# Patient Record
Sex: Female | Born: 1975 | Race: White | Hispanic: Yes | Smoking: Never smoker
Health system: Southern US, Community
[De-identification: ages and names within clinical notes are randomized; demographics above are authoritative.]

## PROBLEM LIST (undated history)

## (undated) DIAGNOSIS — E669 Obesity, unspecified: Secondary | ICD-10-CM

## (undated) DIAGNOSIS — L719 Rosacea, unspecified: Secondary | ICD-10-CM

## (undated) DIAGNOSIS — L309 Dermatitis, unspecified: Secondary | ICD-10-CM

## (undated) DIAGNOSIS — M79671 Pain in right foot: Secondary | ICD-10-CM

## (undated) DIAGNOSIS — N390 Urinary tract infection, site not specified: Secondary | ICD-10-CM

## (undated) DIAGNOSIS — E781 Pure hyperglyceridemia: Secondary | ICD-10-CM

## (undated) DIAGNOSIS — T7840XA Allergy, unspecified, initial encounter: Secondary | ICD-10-CM

## (undated) DIAGNOSIS — G43909 Migraine, unspecified, not intractable, without status migrainosus: Secondary | ICD-10-CM

## (undated) DIAGNOSIS — K429 Umbilical hernia without obstruction or gangrene: Secondary | ICD-10-CM

## (undated) HISTORY — DX: Allergy, unspecified, initial encounter: T78.40XA

## (undated) HISTORY — PX: HYSTEROSCOPY WITH NOVASURE: SHX5574

## (undated) HISTORY — DX: Dermatitis, unspecified: L30.9

## (undated) HISTORY — DX: Urinary tract infection, site not specified: N39.0

## (undated) HISTORY — DX: Obesity, unspecified: E66.9

## (undated) HISTORY — DX: Rosacea, unspecified: L71.9

## (undated) HISTORY — DX: Pain in right foot: M79.671

## (undated) HISTORY — PX: LASIK: SHX215

---

## 1998-11-01 ENCOUNTER — Other Ambulatory Visit: Admission: RE | Admit: 1998-11-01 | Discharge: 1998-11-01 | Payer: Self-pay | Admitting: Obstetrics and Gynecology

## 1998-12-27 ENCOUNTER — Encounter: Payer: Self-pay | Admitting: Obstetrics and Gynecology

## 1998-12-27 ENCOUNTER — Ambulatory Visit (HOSPITAL_COMMUNITY): Admission: RE | Admit: 1998-12-27 | Discharge: 1998-12-27 | Payer: Self-pay | Admitting: Obstetrics and Gynecology

## 1999-02-21 ENCOUNTER — Encounter: Payer: Self-pay | Admitting: *Deleted

## 1999-02-21 ENCOUNTER — Ambulatory Visit (HOSPITAL_COMMUNITY): Admission: RE | Admit: 1999-02-21 | Discharge: 1999-02-21 | Payer: Self-pay | Admitting: *Deleted

## 1999-05-22 ENCOUNTER — Inpatient Hospital Stay (HOSPITAL_COMMUNITY): Admission: AD | Admit: 1999-05-22 | Discharge: 1999-05-22 | Payer: Self-pay | Admitting: Obstetrics and Gynecology

## 1999-05-26 ENCOUNTER — Encounter (HOSPITAL_COMMUNITY): Admission: AD | Admit: 1999-05-26 | Discharge: 1999-05-27 | Payer: Self-pay | Admitting: Obstetrics and Gynecology

## 1999-05-26 ENCOUNTER — Inpatient Hospital Stay (HOSPITAL_COMMUNITY): Admission: AD | Admit: 1999-05-26 | Discharge: 1999-05-28 | Payer: Self-pay | Admitting: Obstetrics and Gynecology

## 1999-11-06 ENCOUNTER — Other Ambulatory Visit: Admission: RE | Admit: 1999-11-06 | Discharge: 1999-11-06 | Payer: Self-pay | Admitting: Obstetrics and Gynecology

## 2000-11-03 ENCOUNTER — Other Ambulatory Visit: Admission: RE | Admit: 2000-11-03 | Discharge: 2000-11-03 | Payer: Self-pay | Admitting: Obstetrics and Gynecology

## 2001-02-14 ENCOUNTER — Ambulatory Visit (HOSPITAL_COMMUNITY): Admission: RE | Admit: 2001-02-14 | Discharge: 2001-02-14 | Payer: Self-pay | Admitting: Obstetrics and Gynecology

## 2001-02-14 ENCOUNTER — Encounter: Payer: Self-pay | Admitting: Obstetrics and Gynecology

## 2001-07-09 ENCOUNTER — Inpatient Hospital Stay (HOSPITAL_COMMUNITY): Admission: AD | Admit: 2001-07-09 | Discharge: 2001-07-11 | Payer: Self-pay | Admitting: Obstetrics and Gynecology

## 2001-11-29 ENCOUNTER — Ambulatory Visit: Admit: 2001-11-29 | Disposition: A | Discharge status: Home / Self Care | Payer: Self-pay

## 2002-12-07 ENCOUNTER — Ambulatory Visit: Admit: 2002-12-07 | Disposition: A | Discharge status: Home / Self Care | Payer: Self-pay

## 2003-01-22 ENCOUNTER — Ambulatory Visit (HOSPITAL_COMMUNITY): Admission: RE | Admit: 2003-01-22 | Discharge: 2003-01-22 | Payer: Self-pay | Admitting: Family Medicine

## 2003-04-23 ENCOUNTER — Encounter: Admission: RE | Admit: 2003-04-23 | Discharge: 2003-04-23 | Payer: Self-pay | Admitting: Family Medicine

## 2003-11-30 ENCOUNTER — Ambulatory Visit: Admit: 2003-11-30 | Disposition: A | Discharge status: Home / Self Care | Payer: Self-pay

## 2004-01-09 ENCOUNTER — Encounter (INDEPENDENT_AMBULATORY_CARE_PROVIDER_SITE_OTHER): Payer: Self-pay | Admitting: *Deleted

## 2004-01-09 ENCOUNTER — Ambulatory Visit (HOSPITAL_COMMUNITY): Admission: RE | Admit: 2004-01-09 | Discharge: 2004-01-09 | Payer: Self-pay | Admitting: Obstetrics and Gynecology

## 2004-01-09 HISTORY — PX: DILATION AND CURETTAGE OF UTERUS: SHX78

## 2005-04-09 ENCOUNTER — Other Ambulatory Visit: Admission: RE | Admit: 2005-04-09 | Discharge: 2005-04-09 | Payer: Self-pay | Admitting: Obstetrics and Gynecology

## 2005-09-19 ENCOUNTER — Inpatient Hospital Stay (HOSPITAL_COMMUNITY): Admission: AD | Admit: 2005-09-19 | Discharge: 2005-09-19 | Payer: Self-pay | Admitting: Obstetrics and Gynecology

## 2005-09-20 ENCOUNTER — Inpatient Hospital Stay (HOSPITAL_COMMUNITY): Admission: AD | Admit: 2005-09-20 | Discharge: 2005-09-20 | Payer: Self-pay | Admitting: Internal Medicine

## 2005-09-26 ENCOUNTER — Inpatient Hospital Stay (HOSPITAL_COMMUNITY): Admission: AD | Admit: 2005-09-26 | Discharge: 2005-09-28 | Payer: Self-pay | Admitting: Obstetrics and Gynecology

## 2007-10-21 ENCOUNTER — Other Ambulatory Visit: Admission: RE | Admit: 2007-10-21 | Discharge: 2007-10-21 | Payer: Self-pay | Admitting: Obstetrics and Gynecology

## 2007-12-12 ENCOUNTER — Ambulatory Visit (HOSPITAL_COMMUNITY): Admission: RE | Admit: 2007-12-12 | Discharge: 2007-12-12 | Payer: Self-pay | Admitting: Obstetrics & Gynecology

## 2010-04-05 ENCOUNTER — Encounter: Payer: Self-pay | Admitting: Family Medicine

## 2010-07-29 NOTE — Op Note (Signed)
NAME:  Sara Sellers, Sara Sellers                  ACCOUNT NO.:  [id number]   MEDICAL RECORD NO.:  [id number]          PATIENT TYPE:  AMB   LOCATION:  SDC                           FACILITY:  [facility]   PHYSICIAN:  M. Leda Quail, MD  DATE OF BIRTH:  [birth year]   DATE OF PROCEDURE:  12/12/2007  DATE OF DISCHARGE:                               OPERATIVE REPORT   PREOPERATIVE DIAGNOSES:  51. A 35 year old, gravida 4, para 3, married white female with      menorrhagia.  2. History of elevated small-particle low-density lipoproteins.  3. Husband with vasectomy.   POSTOPERATIVE DIAGNOSES:  25. A 36 year old, gravida 4, para 3, married white female with      menorrhagia.  2. History of elevated small-particle low-density lipoproteins.  3. Husband with vasectomy.   PROCEDURE:  Hysteroscopy with NovaSure ablation.   SURGEON:  M. Leda Quail, MD   ASSISTANT:  OR staff.   ANESTHESIA:  LMA, Quillian Quince, MD saw the case.   FINDINGS:  On hysteroscopy, normal-appearing endometrial cavity with  normal-appearing fallopian tubes or tubal ostia.   SPECIMEN:  No specimens were obtained.  However, the patient has had a  preoperative endometrial biopsy, which was benign for any abnormal  cells.   ESTIMATED BLOOD LOSS:  Minimal.   FLUIDS:  1000 mL of LR.   FLUID DEFICIT:  From the hysteroscopy, 60 mL of 3% sorbitol with fair  amount of this on the floor.   URINE OUTPUT:  30 mL drained with an I and O catheterization at the  beginning of the procedure.   INDICATIONS:  [name] is a 35 year old, G4, P3, married white female  who has a history of significant menorrhagia.  She has regular cycles,  but her flow lasts 5-7 days with very heavy flow x2 days using super  pads, often changing every hour or so, with clots and back pain.  She  does have a history of headache and some migraines, which are made worse  with estrogen-containing birth control pills.  She is not a candidate  for this, so we  discussed other options including Mirena IUD versus  ablation.  She is very concerned about receiving any hormones, which she  does not have to, and so we decided to proceed with an ablation.  Risks  and benefits have been explained to her and are documented in our office  chart.  She did have a preoperative biopsy, which showed normal  endometrial cells.  The patient presents for procedure today.   PROCEDURE IN DETAIL:  The patient was taken to the operating room.  She  was placed in a supine position.  Anesthesia was administered by the  anesthesia staff without difficulty.  Legs were positioned in the high  lithotomy position in the Gross stirrups.  SCDs were on the lower  extremities bilaterally.  Perineum, inner thighs, and vagina were  prepped and draped in normal sterile fashion.  A red rubber Foley  catheter was used to drain the bladder of all urine.  A bivalve speculum  was placed in the  vagina.  The anterior lip of the cervix was grasped  with single-tooth tenaculum.  The uterus sounded to 9 cm.  This was in  agreement with what we measured on ultrasound.  The cervix was then  dilated with Shawnie Pons dilators to #15.  A 2.9-mm diagnostic hysteroscope  was then obtained.  This was passed into the endometrial canal.  A 3%  sorbitol was used as a hysteroscopic media.  The cavity was visualized.  No lesions were noted.  No abnormality of the cavity was noted.  Tubal  ostia noted bilaterally.  Photo documentation was made.  The  hysteroscope was removed.  Using [facility] dilators, cervix was then dilated  up to #8.  A paracervical block with 1% lidocaine mixed 1:1 with  epinephrine (1:100,000 units) was instilled to the cervix.  A 2.5 mL  were instilled at the 3, 6, 9, and 12 o'clock positions respectively.  A  10 mL total was used for the paracervical block.  The NovaSure apparatus  was obtained.  This was opened outside of the patient.  The array was  working properly.  The vacuum bowel  was tapped.  Then, the device was  placed inside the uterus.  Then, the array was opened inside the  endometrial cavity.  The device was seated by moving the device to the  right and left and up and down twisting to 9 degrees in each direction.  This was performed twice.  The cavity length had previously been  measured with a total cavity being 4.5 cm and the cervical length being  4.5 cm with a cavity length of 4.5 cm.  The cavity width on the NovaSure  device was 4.5 as well.  These were set at the NovaSure device.  The  cervical seal was applied across the tip of the cervix.  The cavity  assessment test was performed without difficulty.  This was passed  easily.  Then, the ablation cycle was performed.  This was performed for  120 seconds with a power of 111.  Once the ablation cycle was completed,  the array was closed and was withdrawn from the uterine cavity.  The  device was opened outside of the patient, and cauterized tissue was  noted.  Hysteroscopy was then performed to visualize the endometrial  cavity, and good ablation was noted.  There was no fluid deficit during  this portion of the procedure.  Photo documentation was made, and the  hysteroscope was removed.  The tenaculum was removed from the cervix.  Silver nitrate was used to make the tenaculum sites hemostatic.  All of  the instruments were removed from the vagina.  The perineum was cleansed  with a Betadine prep.  The legs were positioned back in a supine  position.  The patient was awakened from anesthesia and extubated  without difficulty.   Sponge, lap, needle, and instrument counts were correct x2.  The patient  tolerated the procedure well and was taken to the recovery room in  stable condition.      Lum Keas, MD  Electronically Signed     MSM/MEDQ  D:  12/12/2007  T:  12/12/2007  Job:  [id number]

## 2010-08-01 NOTE — Discharge Summary (Signed)
[facility]  Patient:    Sara Sellers, Sara Sellers                         MRN: [id number] Adm. Date:  [id number] Disc. Date: [id number] Attending:  Oliver Pila                           Discharge Summary  DISCHARGE DIAGNOSES:          1. Status post term pregnancy at 40+ weeks,                                  delivered.                               2. Status post normal spontaneous vaginal delivery.  DISCHARGE MEDICATIONS:        1. Motrin 600 mg p.o. every six hours.                               2. Prenatal vitamins one p.o. daily.  FOLLOW-UP:                    The patient is to follow up in our office in approximately six weeks for her routine visit.  HOSPITAL COURSE:              The patient is a 35 year old, gravida 1, para 0, t 40+ weeks who is admitted with contractions every four minutes for several hours. She had had some bloody show and rupture of membranes on admission in triage.  PRENATAL LABORATORY DATA:     O positive, antibody negative, RPR nonreactive, rubella immune, hepatitis B surface antigen negative, HIV declined, GC negative, Chlamydia negative, and GBS negative.  PAST OBSTETRIC HISTORY:       None.  PAST GYN HISTORY:             None.  PAST SURGICAL HISTORY:        None.  PAST MEDICAL HISTORY:         None.  ALLERGIES:                    SEPTRA and AMOXICILLIN.  MEDICATIONS:                  1. Prenatal vitamins.                               2. Iron.  PHYSICAL EXAMINATION:         VITAL SIGNS: On admission she was afebrile with stable vital signs.  PELVIC: Cervix was completely effaced, 6 cm, and 0 station. She was grossly ruptured with clear fluid.  Fetal heart rate was reassuring.  HOSPITAL COURSE:              The patient progressed very quickly to complete and pushed well with no anesthesia with a vigorous female infant delivered.  Apgars  were 9 and 9.  Weight was 6 pounds 6 ounces.  The placenta  delivered spontaneously. She had a right labial laceration which was repaired with interrupted figure-of-eight sutures of 3-0 Vicryl.  The cervix, rectum, and perineum were intact.  Estimated blood loss was approximately 350 cc.  On postpartum day #2, he patient was doing well.  She was afebrile with stable vital signs.  Her pain was well controlled.  Therefore, she was discharged to home with follow-up and medications as previously stated. DD:  05/28/99 TD:  05/28/99 Job: [id number]

## 2010-08-01 NOTE — Discharge Summary (Signed)
[facility]  Patient:    Sara Sellers, Sara Sellers Visit Number: [id number] MRN: [id number]          Service Type: OBS Location: 910A 9135 01 Attending Physician:  Malon Kindle Dictated by:   Malachi Pro. Ambrose Mantle, M.D. Admit Date:  07/09/2001 Discharge Date: 07/11/2001                             Discharge Summary  HISTORY OF PRESENT ILLNESS:   The patient is a 35 year old white married female, para 1-0-0-1, gravida 2, last period October 03, 2000, [facility] July 11, 2001 by dates and Jul 17, 2001 by ultrasound admitted in active labor.  Blood group and type O positive with a negative antibody, nonreactive serology, rubella immune, hepatitis B surface antigen negative, HIV declined, GC and Chlamydia negative, declined triple screen, 1-hour glucola 97, group B strep negative. Vaginal ultrasound on December 08, 2000 crown-rump length 1.85 cm, 8 weeks 3 days, [facility] Jul 17, 2001.  Repeat ultrasound on February 14, 2001 average gestational age [redacted] weeks 1 day, [facility] Jul 17, 2001.  Prenatal care was uncomplicated.  She began contracting on July 08, 2001 and came here at approximately 1:30 a.m. on the day of admission.  The cervix was 4 cm in MAU and after arriving on labor and delivery was 7 cm.  PAST MEDICAL HISTORY:  ALLERGIES:                    SEPTRA and AMOXICILLIN.  ILLNESSES:                    Migraines.  OPERATIONS:                   None.  FAMILY HISTORY:               Maternal grandfather with coronary artery disease, high blood pressure, bone and lung cancer and diabetes.  Maternal grandmother congestive heart failure, high blood pressure, diabetes, thyroid trouble.  Mother with high blood pressure.  ALCOHOL/TOBACCO/DRUGS:        None.  OBSTETRIC HISTORY:            In March 2001, the patient had a 6-pound 6-ounce female delivered vaginally.  HOSPITAL COURSE:              On admission her vital signs were normal, the abdomen was soft, fundal height  had been 37 cm on June 27, 2001, fetal heart tones were normal, contractions every 2 minutes, cervix 9 cm, 100%, vertex at -1.  The patient rapidly progressed to full dilatation and brought the vertex to the perineum.  She delivered spontaneously ROA over a second-degree midline laceration by Dr. Ambrose Mantle a living female infant 7 pounds 14 ounces, Apgars of 9 at one and 9 at five minutes.  Placenta was intact.  Uterus normal. Second-degree midline laceration repaired with 3-0 Dexon.  Uterus somewhat boggy and clots removed.                                Postpartum the patient did well and was discharged on the second postpartum day.  LABORATORY DATA:              Initial hemoglobin 13.6, hematocrit 38.6, white count 9300, platelet count 157,000.  Followup hemoglobin 11.9, hematocrit 34.2, white count 9100, platelet count 139,000.  RPR was nonreactive.  FINAL DIAGNOSIS:              Intrauterine pregnancy 39 weeks delivered right                               occiput anterior.  OPERATIONS:                   1. Spontaneous delivery ROA.                               2. Repair of second-degree midline laceration.  FINAL CONDITION:              Improved.  DISCHARGE INSTRUCTIONS:       Instructions include our regular discharge instruction booklet.  She is given a prescription for Motrin 600 mg 20 tablets one every 6 hours as needed for pain.  She is to return in 6 weeks for followup examination.Dictated by:   Malachi Pro. Ambrose Mantle, M.D. Attending Physician:  Malon Kindle DD:  07/11/01 TD:  07/11/01 Job: [phone] [id number]

## 2010-08-01 NOTE — Discharge Summary (Signed)
Sara Sellers, Sara Sellers                  ACCOUNT NO.:  [id number]   MEDICAL RECORD NO.:  [id number]          PATIENT TYPE:  INP   LOCATION:  9120                          FACILITY:  [facility]   PHYSICIAN:  Malachi Pro. Ambrose Mantle, M.D. DATE OF BIRTH:  1975-09-01   DATE OF ADMISSION:  09/26/2005  DATE OF DISCHARGE:  09/28/2005                                 DISCHARGE SUMMARY   A 35 year old white female para 2-0-1-2, gravida 4, EDC October 08, 2005 by 11-  week ultrasound presented complaining of contractions and bloody show.  On  evaluation in the maternity admission unit the cervix changed from 3 to 4 cm  and the patient was admitted.  Blood group and type O+ with a negative  antibody, RPR nonreactive, rubella equivocal, hepatitis B surface antigen  negative, GC and chlamydia negative.  One-hour Glucola 108.  Group B Strep  negative.   PRENATAL CARE:  The patient had migraines, otherwise uncomplicated.   OBSTETRIC HISTORY:  Spontaneous vaginal delivery at term x2 6 pounds 6  ounces and 7 pounds 14 ounces, spontaneous abortion x1.   PAST MEDICAL HISTORY:  Migraines.   ALLERGIES:  SEPTRA and AMOXICILLIN.   SURGICAL HISTORY:  Negative.   MEDICATIONS:  None.   On admission the patient was afebrile with normal vital signs.  Heart and  lungs were normal.  Abdomen: Soft, gravid.  Estimated fetal weight about 8  pounds.  Cervix was 9 cm, 90%, vertex at a zero station.  Artificial rupture  of the membranes produced clear fluid.  Dr. Jackelyn Knife was in the room next  door and was called to say the baby was out.  He walked in the room and the  baby was on the mother's abdomen.  There had been an apparent spontaneous  vaginal delivery of a living female infant 8 pounds 8 ounces, Apgars of 8 at  one and 9 at five minutes.  Dr. Jackelyn Knife clamped the cord and delivered the  placenta spontaneously and intact.  First degree laceration repaired with 3-  0 Vicryl with local block.  Blood loss about 500 mL.  Postpartum  the patient  did well and was discharged on the second postpartum day.  Laboratory data  showed an initial hemoglobin of 13.8, hematocrit 39.3, white count 11,800,  platelet count 169,000.  Follow-up hemoglobin 11.7, hematocrit 33.4.  RPR  was nonreactive.   FINAL DIAGNOSIS:  Intrauterine pregnancy at 38-1/2 weeks delivered vertex.   OPERATION:  1.  Spontaneous delivery vertex.  2.  Repair of small perineal laceration.   FINAL CONDITION:  Improved.   INSTRUCTIONS:  Include our regular discharge instruction booklet.  She is  counseled about the need for rubella vaccine.  Patient is advised to return  in six weeks for follow-up examination.  She declines analgesics at  discharge.      Malachi Pro. Ambrose Mantle, M.D.  Electronically Signed     TFH/MEDQ  D:  09/28/2005  T:  09/28/2005  Job:  [id number]

## 2010-08-01 NOTE — Op Note (Signed)
NAME:  Sara Sellers, Sara Sellers                  ACCOUNT NO.:  [id number]   MEDICAL RECORD NO.:  [id number]          PATIENT TYPE:  AMB   LOCATION:  DAY                          FACILITY:  [facility]   PHYSICIAN:  Malachi Pro. Ambrose Mantle, M.D. DATE OF BIRTH:  1975-08-18   DATE OF PROCEDURE:  01/09/2004  DATE OF DISCHARGE:                                 OPERATIVE REPORT   PREOPERATIVE DIAGNOSIS:  Nonviable intrauterine pregnancy at approximately  10 weeks.  Embryo never grew, never was more than 0.46 cm long.  Never had a  heartbeat.   POSTOPERATIVE DIAGNOSIS:  Nonviable intrauterine pregnancy at approximately  10 weeks.  Embryo never grew, never was more than 0.46 cm long.  Never had a  heartbeat.   OPERATION:  Suction dilation and curettage.   OPERATION:  Malachi Pro. Ambrose Mantle, M.D.   ANESTHESIA:  General anesthesia.   The patient was brought to the operating room and placed under satisfactory  general anesthesia and then placed in lithotomy position.  The vulva,  vagina, and perineum were prepped with Betadine solution.  On examination,  the bladder was full, so I emptied the bladder with a [place] catheter.  Then  the uterus could be felt anterior, upper limit of normal size.  The adnexa  were free of masses.  Cervix was drawn into the operative field after the  area was prepped and draped sterilely.  The uterus sounded to about 9 cm  anteriorly.  The cervix was dilated up with Hank dilators until a #8 curved  suction curet would pass freely into the endometrial cavity.  I then did a  suction D&C, followed that with a sharp D&C to ensure that the walls felt  smooth, and did a final circuit with the suction curet.  Where the tenaculum  site had been, both areas bled and did not respond to pressure, so a 2-0  chromic suture was used to control the bleeding.  At the termination of the  procedure, there was no bleeding.  The patient seemed to be doing well.  Estimation of blood loss was difficult because the  surgical assistant had  suctioned the tubing clear with saline, and the amount of blood in the  suction before this had not been noticed.  It was estimated that the blood  was about 100 cc.  Sponge and needle counts were correct, and the patient  was returned to recovery in satisfactory condition.      TFH/MEDQ  D:  01/09/2004  T:  01/09/2004  Job:  [id number]

## 2010-12-15 LAB — URINALYSIS, ROUTINE W REFLEX MICROSCOPIC
Nitrite: NEGATIVE
Protein, ur: NEGATIVE
Specific Gravity, Urine: >1.03 — ABNORMAL HIGH
Urobilinogen, UA: 0.2

## 2010-12-15 LAB — CBC
Platelets: 244
RDW: 11.4 — ABNORMAL LOW
WBC: 4.6

## 2010-12-15 LAB — PREGNANCY, URINE: Preg Test, Ur: NEGATIVE

## 2011-05-10 ENCOUNTER — Emergency Department
Admission: EM | Admit: 2011-05-10 | Discharge: 2011-05-10 | Disposition: A | Discharge status: Home / Self Care | Payer: 59 | Source: Home / Self Care

## 2011-05-10 DIAGNOSIS — M25512 Pain in left shoulder: Secondary | ICD-10-CM

## 2011-05-10 DIAGNOSIS — M25519 Pain in unspecified shoulder: Secondary | ICD-10-CM

## 2011-05-10 NOTE — ED Notes (Signed)
States on Friday she was pushing herself up in bed with her elbow and felt something slip in left shoulder. States pain radiates down her hand.

## 2011-05-10 NOTE — ED Provider Notes (Signed)
History     CSN: [id number]  Arrival date & time 05/10/11  1114   None     Chief Complaint  Patient presents with  . Shoulder Pain    (Consider location/radiation/quality/duration/timing/severity/associated sxs/prior treatment) HPI This is a right-handed female who states that 2 days ago she was pushing herself up on her elbow and felt something slip pop around her left shoulder. She states that the pain radiated down into her elbow and her arm. Since then she has had some uncomfortable feelings distally in her left arm and feels tenderness in her axilla. She does not notice any weakness. She is able to move her arm around normally. 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 She has not been using any medications or modalities. She states the pain is sharp.  No past medical history on file.  No past surgical history on file.  No family history on file.  History  Substance Use Topics  . Smoking status: Not on file  . Smokeless tobacco: Not on file  . Alcohol Use: Not on file    OB History    No data available      Review of Systems  Allergies  Amoxicillin and Septra  Home Medications  No current outpatient prescriptions on file.  BP 131/87  Pulse 71  Temp(Src) 98.3 F (36.8 C) (Oral)  Resp 18  Ht 5\' 1"  (1.549 m)  Wt 169 lb 9 oz (76.913 kg)  BMI 32.04 kg/m2  SpO2 99%  Physical Exam  Nursing note and vitals reviewed. Constitutional: She is oriented to person, place, and time. She appears well-developed and well-nourished.  HENT:  Head: Normocephalic and atraumatic.  Eyes: No scleral icterus.  Neck: Neck supple.  Cardiovascular: Regular rhythm and normal heart sounds.   Pulmonary/Chest: Effort normal and breath sounds normal. No respiratory distress.  Musculoskeletal:       Left arm examination demonstrates a normal distal neurovascular status.. And sensation of the median, ulnar,  and radial nerves. Spurling test and the neck is negative. There is no neck or spinous process tenderness. Full range of motion of the neck. Shoulder examination demonstrates full range of motion and provocative tests to cause some soreness in the axilla. She is mostly tender to palpation in the axillary  muscles such as the pectoralis and serratus.  Neurological: She is alert and oriented to person, place, and time.  Skin: Skin is warm and dry.  Psychiatric: She has a normal mood and affect. Her speech is normal.    ED Course  Procedures (including critical care time)  Labs Reviewed - No data to display No results found.   1. Left shoulder pain       MDM   I feel that her pain is most likely due to muscle strain in water axillary muscles. I believe that these temporarily pushed on part or all of her brachial plexus and caused some of his sensation that she is feeling. However I believe that this is most likely a temporary thing. I advised her to take naproxen twice a day as well as ice the area. If her pain is not resolving in the next week, then the next step should be taken about getting her to see a sports medicine provider and have x-rays taken. The patient understands and agrees with this course of action.      Lily Kocher, MD 05/10/11 [phone]

## 2012-11-10 ENCOUNTER — Encounter: Payer: Self-pay | Admitting: Certified Nurse Midwife

## 2012-11-10 ENCOUNTER — Ambulatory Visit (INDEPENDENT_AMBULATORY_CARE_PROVIDER_SITE_OTHER): Payer: 59 | Admitting: Certified Nurse Midwife

## 2012-11-10 DIAGNOSIS — Z0000 Encounter for general adult medical examination without abnormal findings: Secondary | ICD-10-CM

## 2012-11-10 DIAGNOSIS — Z01419 Encounter for gynecological examination (general) (routine) without abnormal findings: Secondary | ICD-10-CM

## 2012-11-10 LAB — POCT URINALYSIS DIPSTICK
Urobilinogen, UA: NEGATIVE
pH, UA: 5

## 2012-11-10 NOTE — Patient Instructions (Signed)

## 2012-11-10 NOTE — Progress Notes (Signed)
37 y.o. G4P3  Married Caucasian F here for annual exam.  Periods none since ablation. Occasional menstrual like symptoms monthly. Did not follow up with elevated triglyceride level with pcp, but has appointment scheduled now.  Also has appointment scheduled for consult on umbilical hernia repair. No health issues today.. Denies any urinary symptoms or vaginal symptoms, but experiencing menstrual like symptoms today.  Patient's last menstrual period was 11/15/2006.          Sexually active: yes  The current method of family planning is spouse vasectomy.    Exercising: yes  walk at work, walking once/wk outside of work Smoker:  no  Health Maintenance: Pap:  11/09/11 NEG HR HPV History of abnormal Pap:  no MMG:  none Colonoscopy:  none BMD:   none TDaP:  2010 Screening Labs: Hb today: PCP, Urine today: Leuks 2     No past medical history on file.  Past Surgical History  Procedure Laterality Date  . Novasure ablation  9/09  . Dilation and curettage of uterus  2005  . Hysteroscopy  9/09    & novasure ablation    No current outpatient prescriptions on file.   No current facility-administered medications for this visit.    No family history on file.  ROS:  Pertinent items are noted in HPI.  Otherwise, a comprehensive ROS was negative.  Exam:   LMP 11/15/2006  Weight change: @WEIGHTCHANGE @ Height:      Ht Readings from Last 3 Encounters:  05/10/11 5\' 1"  (1.549 m)    General appearance: alert, cooperative and appears stated age Head: Normocephalic, without obvious abnormality, atraumatic Neck: no adenopathy, supple, symmetrical, trachea midline and thyroid normal to inspection and palpation Lungs: clear to auscultation bilaterally Breasts: normal appearance, no masses or tenderness, No nipple retraction or dimpling, No nipple discharge or bleeding, No axillary or supraclavicular adenopathy Heart: regular rate and rhythm Abdomen: soft, non-tender; bowel sounds normal; no  masses,  no organomegaly, umbilical hernia noted Extremities: extremities normal, atraumatic, no cyanosis or edema Skin: Skin color, texture, turgor normal. No rashes or lesions Lymph nodes: Cervical, supraclavicular, and axillary nodes normal. No abnormal inguinal nodes palpated Neurologic: Grossly normal   Pelvic: External genitalia:  no lesions              Urethra:  normal appearing urethra with no masses, tenderness or lesions, bladder non tender              Bartholins and Skenes: normal                 Vagina: normal appearing vagina with normal color and normal discharge, no lesions              Cervix: normal, non tender              Pap taken: no Bimanual Exam:  Uterus:  normal size, contour, position, consistency, mobility, non-tender and anteflexed              Adnexa: normal adnexa and no mass, fullness, tenderness               Rectovaginal: Confirms               Anus:  normal sphincter tone, no lesions  A:  Well Woman with normal exam  Ablation 2008 working well  History of elevated triglycerides with PCP appointment now  Umbilical hernia has appointment for repair option  P:   Reviewed health and wellness pertinent to exam  Stressed importance of follow up and life long health implications if untreated. Patient understands.  Keep appointment as planned  pap smear not taken today  counseled on breast self exam, adequate intake of calcium and vitamin D, diet and exercise, Kegel's exercises  return annually or prn  An After Visit Summary was printed and given to the patient.

## 2012-11-10 NOTE — Progress Notes (Signed)
Note reviewed, agree with plan.  Noemi Ishmael, MD  

## 2012-11-18 ENCOUNTER — Ambulatory Visit (INDEPENDENT_AMBULATORY_CARE_PROVIDER_SITE_OTHER): Payer: 59 | Admitting: General Surgery

## 2012-11-18 ENCOUNTER — Encounter (INDEPENDENT_AMBULATORY_CARE_PROVIDER_SITE_OTHER): Payer: Self-pay | Admitting: General Surgery

## 2012-11-18 VITALS — BP 118/72 | HR 70 | Temp 99.0°F | Resp 18 | Ht 61.0 in | Wt 170.0 lb

## 2012-11-18 DIAGNOSIS — K429 Umbilical hernia without obstruction or gangrene: Secondary | ICD-10-CM

## 2012-11-18 NOTE — Progress Notes (Signed)
Patient ID: Sara Sellers, female   DOB: 21-Jul-1975, 37 y.o.   MRN: [id number]  Chief Complaint  Patient presents with  . Establish Care    hernia    HPI Sara Sellers is a 37 y.o. female.  HPI This is a 37 year old female who presents with about a 13 year history of an umbilical hernia. This was noted with her first pregnancy. This is gotten bigger over that time period has now become much more symptomatic and is causing her pain and discomfort. She feels like that there are times where she cannot reduce this. She works as a Buyer, retail at [employer]. There's been no changes in either her bowel or bladder habits with this. She was actually scheduled for this before and decided that she did not want to do it at that time. She returns today and would like to discuss surgery. Past Medical History  Diagnosis Date  . Migraine with aura   . History of menorrhagia     Past Surgical History  Procedure Laterality Date  . Novasure ablation  9/09  . Dilation and curettage of uterus  2005  . Hysteroscopy  9/09    & novasure ablation    Family History  Problem Relation Age of Onset  . Diabetes Mother   . Uterine cancer Mother   . Hypertension Mother   . Hypertension Father   . Diabetes Maternal Grandmother   . Hypothyroidism Maternal Grandmother   . Diabetes Maternal Grandfather   . Hypertension Maternal Grandfather   . Heart disease Maternal Grandfather   . Cancer Maternal Grandfather     lung w/ mets to bone  . Hyperlipidemia Brother   . Stroke Paternal Grandmother   . Heart disease Paternal Grandfather   . Hyperlipidemia Brother     Social History History  Substance Use Topics  . Smoking status: Never Smoker   . Smokeless tobacco: Not on file  . Alcohol Use: No    Allergies  Allergen Reactions  . Amoxicillin Hives  . Septra [Bactrim] Hives    Current Outpatient Prescriptions  Medication Sig Dispense Refill  . fenofibrate 160 MG tablet        No current  facility-administered medications for this visit.    Review of Systems Review of Systems  Constitutional: Negative for fever, chills and unexpected weight change.  HENT: Negative for hearing loss, congestion, sore throat, trouble swallowing and voice change.   Eyes: Negative for visual disturbance.  Respiratory: Negative for cough and wheezing.   Cardiovascular: Negative for chest pain, palpitations and leg swelling.  Gastrointestinal: Negative for nausea, vomiting, abdominal pain, diarrhea, constipation, blood in stool, abdominal distention and anal bleeding.  Genitourinary: Negative for hematuria, vaginal bleeding and difficulty urinating.  Musculoskeletal: Negative for arthralgias.  Skin: Negative for rash and wound.  Neurological: Negative for seizures, syncope and headaches.  Hematological: Negative for adenopathy. Does not bruise/bleed easily.  Psychiatric/Behavioral: Negative for confusion.    Blood pressure 118/72, pulse 70, temperature 99 F (37.2 C), resp. rate 18, height 5\' 1"  (1.549 m), weight 170 lb (77.111 kg), last menstrual period 11/15/2006.  Physical Exam Physical Exam  Vitals reviewed. Constitutional: She appears well-developed and well-nourished.  Neck: Neck supple.  Cardiovascular: Normal rate, regular rhythm and normal heart sounds.   Pulmonary/Chest: Effort normal and breath sounds normal. She has no wheezes. She has no rales.  Abdominal: Soft. She exhibits no distension. There is no tenderness. A hernia (reducible umbilical hernia) is present.  Lymphadenopathy:  She has no cervical adenopathy.    Data Reviewed Prior notes  Assessment    Umbilical hernia     Plan    Umbilical hernia repair with mesh  The series done much more symptomatic. I discussed with her the pathophysiology of an umbilical hernia. We discussed both open and laparoscopic repair. I think that this would be most amenable to an open repair in this would be the simplest fix to  this. We discussed repairing this with mesh. We discussed the risks to include but not limited to bleeding, infection, and recurrence of this hernia. We discussed the postoperative course as well as 3-4 weeks out of work. We'll plan on doing this in November per her request.        Sara Sellers 11/18/2012, 10:21 AM

## 2013-01-14 DIAGNOSIS — K429 Umbilical hernia without obstruction or gangrene: Secondary | ICD-10-CM

## 2013-01-14 HISTORY — DX: Umbilical hernia without obstruction or gangrene: K42.9

## 2013-01-16 ENCOUNTER — Encounter (HOSPITAL_BASED_OUTPATIENT_CLINIC_OR_DEPARTMENT_OTHER): Payer: Self-pay | Admitting: *Deleted

## 2013-01-16 NOTE — Pre-Procedure Instructions (Signed)
To come for BMET and CBC, diff 

## 2013-01-19 ENCOUNTER — Other Ambulatory Visit: Payer: Self-pay

## 2013-01-20 ENCOUNTER — Encounter (HOSPITAL_BASED_OUTPATIENT_CLINIC_OR_DEPARTMENT_OTHER)
Admission: RE | Admit: 2013-01-20 | Discharge: 2013-01-20 | Disposition: A | Discharge status: Home / Self Care | Payer: 59 | Source: Ambulatory Visit | Attending: General Surgery | Admitting: General Surgery

## 2013-01-20 LAB — CBC WITH DIFFERENTIAL/PLATELET
Basophils Absolute: 0 10*3/uL (ref 0.0–0.1)
Basophils Relative: 0 % (ref 0–1)
Eosinophils Absolute: 0.1 10*3/uL (ref 0.0–0.7)
Eosinophils Relative: 1 % (ref 0–5)
Lymphs Abs: 1.3 10*3/uL (ref 0.7–4.0)
MCH: 30.9 pg (ref 26.0–34.0)
MCHC: 36.2 g/dL — ABNORMAL HIGH (ref 30.0–36.0)
MCV: 85.2 fL (ref 78.0–100.0)
Neutrophils Relative %: 58 % (ref 43–77)
Platelets: 286 10*3/uL (ref 150–400)
RBC: 4.47 MIL/uL (ref 3.87–5.11)
RDW: 11.3 % — ABNORMAL LOW (ref 11.5–15.5)

## 2013-01-20 LAB — BASIC METABOLIC PANEL
BUN: 16 mg/dL (ref 6–23)
CO2: 26 mEq/L (ref 19–32)
Calcium: 9.1 mg/dL (ref 8.4–10.5)
Creatinine, Ser: 1.13 mg/dL — ABNORMAL HIGH (ref 0.50–1.10)
GFR calc non Af Amer: 62 mL/min — ABNORMAL LOW (ref >90)
Glucose, Bld: 75 mg/dL (ref 70–99)
Sodium: 138 mEq/L (ref 135–145)

## 2013-01-23 ENCOUNTER — Ambulatory Visit (HOSPITAL_BASED_OUTPATIENT_CLINIC_OR_DEPARTMENT_OTHER): Payer: 59 | Admitting: Anesthesiology

## 2013-01-23 ENCOUNTER — Encounter (HOSPITAL_BASED_OUTPATIENT_CLINIC_OR_DEPARTMENT_OTHER)
Admission: RE | Disposition: A | Discharge status: Home / Self Care | Payer: Self-pay | Source: Ambulatory Visit | Attending: General Surgery

## 2013-01-23 ENCOUNTER — Ambulatory Visit (HOSPITAL_BASED_OUTPATIENT_CLINIC_OR_DEPARTMENT_OTHER)
Admission: RE | Admit: 2013-01-23 | Discharge: 2013-01-23 | Disposition: A | Discharge status: Home / Self Care | Payer: 59 | Source: Ambulatory Visit | Attending: General Surgery | Admitting: General Surgery

## 2013-01-23 ENCOUNTER — Encounter (HOSPITAL_BASED_OUTPATIENT_CLINIC_OR_DEPARTMENT_OTHER): Payer: 59 | Admitting: Anesthesiology

## 2013-01-23 ENCOUNTER — Encounter (HOSPITAL_BASED_OUTPATIENT_CLINIC_OR_DEPARTMENT_OTHER): Payer: Self-pay | Admitting: *Deleted

## 2013-01-23 DIAGNOSIS — K429 Umbilical hernia without obstruction or gangrene: Secondary | ICD-10-CM | POA: Insufficient documentation

## 2013-01-23 HISTORY — PX: HERNIA REPAIR: SHX51

## 2013-01-23 HISTORY — DX: Pure hyperglyceridemia: E78.1

## 2013-01-23 HISTORY — DX: Umbilical hernia without obstruction or gangrene: K42.9

## 2013-01-23 HISTORY — DX: Migraine, unspecified, not intractable, without status migrainosus: G43.909

## 2013-01-23 HISTORY — PX: UMBILICAL HERNIA REPAIR: SHX196

## 2013-01-23 SURGERY — REPAIR, HERNIA, UMBILICAL, ADULT
Anesthesia: General | Site: Abdomen | Wound class: Clean

## 2013-01-23 MED ORDER — CIPROFLOXACIN IN D5W 400 MG/200ML IV SOLN
INTRAVENOUS | Status: AC
  Filled 2013-01-23: qty 200

## 2013-01-23 MED ORDER — OXYCODONE-ACETAMINOPHEN 10-325 MG PO TABS: 1.0000 | ORAL_TABLET | Freq: Four times a day (QID) | ORAL | Status: DC | PRN

## 2013-01-23 MED ORDER — OXYCODONE HCL 5 MG PO TABS
ORAL_TABLET | ORAL | Status: AC
  Filled 2013-01-23: qty 1

## 2013-01-23 MED ORDER — MIDAZOLAM HCL 2 MG/2ML IJ SOLN
INTRAMUSCULAR | Status: AC
  Filled 2013-01-23: qty 2

## 2013-01-23 MED ORDER — BUPIVACAINE HCL (PF) 0.25 % IJ SOLN
INTRAMUSCULAR | Status: AC
  Filled 2013-01-23: qty 30

## 2013-01-23 MED ORDER — OXYCODONE HCL 5 MG/5ML PO SOLN: 5.0000 mg | Freq: Once | ORAL | Status: AC | PRN

## 2013-01-23 MED ORDER — FENTANYL CITRATE 0.05 MG/ML IJ SOLN: 50.0000 ug | INTRAMUSCULAR | Status: DC | PRN

## 2013-01-23 MED ORDER — LIDOCAINE HCL (CARDIAC) 20 MG/ML IV SOLN
INTRAVENOUS | Status: DC | PRN
  Administered 2013-01-23: 30 mg via INTRAVENOUS

## 2013-01-23 MED ORDER — ONDANSETRON HCL 4 MG/2ML IJ SOLN
INTRAMUSCULAR | Status: DC | PRN
  Administered 2013-01-23: 4 mg via INTRAVENOUS

## 2013-01-23 MED ORDER — MIDAZOLAM HCL 5 MG/5ML IJ SOLN
INTRAMUSCULAR | Status: DC | PRN
  Administered 2013-01-23: 2 mg via INTRAVENOUS

## 2013-01-23 MED ORDER — FENTANYL CITRATE 0.05 MG/ML IJ SOLN
25.0000 ug | INTRAMUSCULAR | Status: DC | PRN
  Administered 2013-01-23 (×2): 50 ug via INTRAVENOUS

## 2013-01-23 MED ORDER — BUPIVACAINE HCL (PF) 0.25 % IJ SOLN
INTRAMUSCULAR | Status: DC | PRN
  Administered 2013-01-23: 20 mL

## 2013-01-23 MED ORDER — MIDAZOLAM HCL 2 MG/ML PO SYRP: 12.0000 mg | ORAL_SOLUTION | Freq: Once | ORAL | Status: DC | PRN

## 2013-01-23 MED ORDER — DEXAMETHASONE SODIUM PHOSPHATE 4 MG/ML IJ SOLN
INTRAMUSCULAR | Status: DC | PRN
  Administered 2013-01-23: 10 mg via INTRAVENOUS

## 2013-01-23 MED ORDER — LACTATED RINGERS IV SOLN
INTRAVENOUS | Status: DC
  Administered 2013-01-23 (×2): via INTRAVENOUS

## 2013-01-23 MED ORDER — FENTANYL CITRATE 0.05 MG/ML IJ SOLN
INTRAMUSCULAR | Status: DC | PRN
  Administered 2013-01-23 (×2): 50 ug via INTRAVENOUS

## 2013-01-23 MED ORDER — ONDANSETRON HCL 4 MG/2ML IJ SOLN: 4.0000 mg | Freq: Once | INTRAMUSCULAR | Status: DC | PRN

## 2013-01-23 MED ORDER — CIPROFLOXACIN IN D5W 400 MG/200ML IV SOLN
400.0000 mg | INTRAVENOUS | Status: AC
  Administered 2013-01-23: 400 mg via INTRAVENOUS

## 2013-01-23 MED ORDER — PROPOFOL 10 MG/ML IV EMUL
INTRAVENOUS | Status: AC
  Filled 2013-01-23: qty 50

## 2013-01-23 MED ORDER — MIDAZOLAM HCL 2 MG/2ML IJ SOLN: 1.0000 mg | INTRAMUSCULAR | Status: DC | PRN

## 2013-01-23 MED ORDER — FENTANYL CITRATE 0.05 MG/ML IJ SOLN
INTRAMUSCULAR | Status: AC
  Filled 2013-01-23: qty 2

## 2013-01-23 MED ORDER — PROPOFOL 10 MG/ML IV BOLUS
INTRAVENOUS | Status: DC | PRN
  Administered 2013-01-23: 150 mg via INTRAVENOUS

## 2013-01-23 MED ORDER — FENTANYL CITRATE 0.05 MG/ML IJ SOLN
INTRAMUSCULAR | Status: AC
  Filled 2013-01-23: qty 6

## 2013-01-23 MED ORDER — OXYCODONE HCL 5 MG PO TABS
5.0000 mg | ORAL_TABLET | Freq: Once | ORAL | Status: AC | PRN
  Administered 2013-01-23: 5 mg via ORAL

## 2013-01-23 SURGICAL SUPPLY — 43 items
ADH SKN CLS APL DERMABOND .7 (GAUZE/BANDAGES/DRESSINGS)
APL SKNCLS STERI-STRIP NONHPOA (GAUZE/BANDAGES/DRESSINGS) ×2
BENZOIN TINCTURE PRP APPL 2/3 (GAUZE/BANDAGES/DRESSINGS) ×2 IMPLANT
BLADE SURG 15 STRL LF DISP TIS (BLADE) ×2 IMPLANT
BLADE SURG 15 STRL SS (BLADE) ×3
BLADE SURG ROTATE 9660 (MISCELLANEOUS) ×2 IMPLANT
CHLORAPREP W/TINT 26ML (MISCELLANEOUS) ×3 IMPLANT
COVER MAYO STAND STRL (DRAPES) ×3 IMPLANT
COVER TABLE BACK 60X90 (DRAPES) ×3 IMPLANT
DECANTER SPIKE VIAL GLASS SM (MISCELLANEOUS) ×2 IMPLANT
DERMABOND ADVANCED (GAUZE/BANDAGES/DRESSINGS)
DERMABOND ADVANCED .7 DNX12 (GAUZE/BANDAGES/DRESSINGS) IMPLANT
DRAPE PED LAPAROTOMY (DRAPES) ×3 IMPLANT
DRSG TEGADERM 4X4.75 (GAUZE/BANDAGES/DRESSINGS) ×2 IMPLANT
ELECT COATED BLADE 2.86 ST (ELECTRODE) ×3 IMPLANT
ELECT REM PT RETURN 9FT ADLT (ELECTROSURGICAL) ×3
ELECTRODE REM PT RTRN 9FT ADLT (ELECTROSURGICAL) ×2 IMPLANT
GLOVE BIO SURGEON STRL SZ7 (GLOVE) ×3 IMPLANT
GLOVE BIOGEL PI IND STRL 7.0 (GLOVE) ×2 IMPLANT
GLOVE BIOGEL PI IND STRL 7.5 (GLOVE) ×2 IMPLANT
GLOVE BIOGEL PI INDICATOR 7.0 (GLOVE) ×2
GLOVE BIOGEL PI INDICATOR 7.5 (GLOVE) ×1
GLOVE ECLIPSE 6.5 STRL STRAW (GLOVE) ×2 IMPLANT
GOWN PREVENTION PLUS XLARGE (GOWN DISPOSABLE) ×4 IMPLANT
NEEDLE HYPO 22GX1.5 SAFETY (NEEDLE) ×3 IMPLANT
NS IRRIG 1000ML POUR BTL (IV SOLUTION) IMPLANT
PACK BASIN DAY SURGERY FS (CUSTOM PROCEDURE TRAY) ×3 IMPLANT
PENCIL BUTTON HOLSTER BLD 10FT (ELECTRODE) ×3 IMPLANT
SLEEVE SCD COMPRESS KNEE MED (MISCELLANEOUS) ×2 IMPLANT
SPONGE LAP 4X18 X RAY DECT (DISPOSABLE) ×3 IMPLANT
STRIP CLOSURE SKIN 1/2X4 (GAUZE/BANDAGES/DRESSINGS) ×2 IMPLANT
SUT ETHIBOND 0 MO6 C/R (SUTURE) IMPLANT
SUT MNCRL AB 4-0 PS2 18 (SUTURE) ×3 IMPLANT
SUT NOVA NAB DX-16 0-1 5-0 T12 (SUTURE) ×4 IMPLANT
SUT VIC AB 0 SH 27 (SUTURE) IMPLANT
SUT VIC AB 2-0 SH 27 (SUTURE)
SUT VIC AB 2-0 SH 27XBRD (SUTURE) IMPLANT
SUT VIC AB 3-0 SH 27 (SUTURE) ×3
SUT VIC AB 3-0 SH 27X BRD (SUTURE) ×2 IMPLANT
SUT VICRYL AB 3 0 TIES (SUTURE) IMPLANT
SYR CONTROL 10ML LL (SYRINGE) ×3 IMPLANT
TOWEL OR 17X24 6PK STRL BLUE (TOWEL DISPOSABLE) ×3 IMPLANT
TOWEL OR NON WOVEN STRL DISP B (DISPOSABLE) ×3 IMPLANT

## 2013-01-23 NOTE — Anesthesia Postprocedure Evaluation (Signed)
  Anesthesia Post-op Note  Patient: Sara Sellers  Procedure(s) Performed: Procedure(s): HERNIA REPAIR UMBILICAL ADULT (N/A)  Patient Location: PACU  Anesthesia Type:General  Level of Consciousness: awake, alert  and oriented  Airway and Oxygen Therapy: Patient Spontanous Breathing  Post-op Pain: mild  Post-op Assessment: Post-op Vital signs reviewed, Patient's Cardiovascular Status Stable, Respiratory Function Stable, Patent Airway, No signs of Nausea or vomiting and Pain level controlled  Post-op Vital Signs: stable  Complications: No apparent anesthesia complications

## 2013-01-23 NOTE — Anesthesia Preprocedure Evaluation (Addendum)
Anesthesia Evaluation  Patient identified by MRN, date of birth, ID band Patient awake    Reviewed: Allergy & Precautions, NPO status   Airway Mallampati: I TM Distance: >3 FB Neck ROM: Full    Dental  (+) Teeth Intact and Dental Advisory Given   Pulmonary  breath sounds clear to auscultation        Cardiovascular Rhythm:Regular Rate:Normal     Neuro/Psych  Headaches,    GI/Hepatic   Endo/Other    Renal/GU      Musculoskeletal   Abdominal   Peds  Hematology   Anesthesia Other Findings   Reproductive/Obstetrics                          Anesthesia Physical Anesthesia Plan  ASA: I  Anesthesia Plan: General   Post-op Pain Management:    Induction: Intravenous  Airway Management Planned: LMA  Additional Equipment:   Intra-op Plan:   Post-operative Plan:   Informed Consent: I have reviewed the patients History and Physical, chart, labs and discussed the procedure including the risks, benefits and alternatives for the proposed anesthesia with the patient or authorized representative who has indicated his/her understanding and acceptance.   Dental advisory given  Plan Discussed with: CRNA and Anesthesiologist  Anesthesia Plan Comments: (Umbilical hernia  Plan GA with LMA  Kipp Brood, MD)        Anesthesia Quick Evaluation

## 2013-01-23 NOTE — H&P (Signed)
  HPI  This is a 37 year old female who presents with about a 13 year history of an umbilical hernia. This was noted with her first pregnancy. This is gotten bigger over that time period has now become much more symptomatic and is causing her pain and discomfort. She feels like that there are times where she cannot reduce this. She works as a Buyer, retail at [employer]. There's been no changes in either her bowel or bladder habits with this. She was actually scheduled for this before and decided that she did not want to do it at that time.    Past Medical History   Diagnosis  Date   .  Migraine with aura    .  History of menorrhagia     Past Surgical History   Procedure  Laterality  Date   .  Novasure ablation   9/09   .  Dilation and curettage of uterus   2005   .  Hysteroscopy   9/09     & novasure ablation    Family History   Problem  Relation  Age of Onset   .  Diabetes  Mother    .  Uterine cancer  Mother    .  Hypertension  Mother    .  Hypertension  Father    .  Diabetes  Maternal Grandmother    .  Hypothyroidism  Maternal Grandmother    .  Diabetes  Maternal Grandfather    .  Hypertension  Maternal Grandfather    .  Heart disease  Maternal Grandfather    .  Cancer  Maternal Grandfather      lung w/ mets to bone   .  Hyperlipidemia  Brother    .  Stroke  Paternal Grandmother    .  Heart disease  Paternal Grandfather    .  Hyperlipidemia  Brother    Social History  History   Substance Use Topics   .  Smoking status:  Never Smoker   .  Smokeless tobacco:  Not on file   .  Alcohol Use:  No    Allergies   Allergen  Reactions   .  Amoxicillin  Hives   .  Septra [Bactrim]  Hives    Current Outpatient Prescriptions   Medication  Sig  Dispense  Refill   .  fenofibrate 160 MG tablet       No current facility-administered medications for this visit.   Review of Systems  Review of Systems  Constitutional: Negative for fever, chills and unexpected weight  change.  HENT: Negative for hearing loss, congestion, sore throat, trouble swallowing and voice change.  Eyes: Negative for visual disturbance.  Respiratory: Negative for cough and wheezing.  Cardiovascular: Negative for chest pain, palpitations and leg swelling.  Gastrointestinal: Negative for nausea, vomiting, abdominal pain, diarrhea, constipation, blood in stool, abdominal distention and anal bleeding.   Physical Exam  Physical Exam  Vitals reviewed.  Constitutional: She appears well-developed and well-nourished.  Neck: Neck supple.  Cardiovascular: Normal rate, regular rhythm and normal heart sounds.  Pulmonary/Chest: Effort normal and breath sounds normal. She has no wheezes. She has no rales.  Abdominal: Soft. She exhibits no distension. There is no tenderness. A hernia (reducible umbilical hernia) is present.    Assessment  Umbilical hernia   Plan  Umbilical hernia repair with mesh

## 2013-01-23 NOTE — Transfer of Care (Signed)
Immediate Anesthesia Transfer of Care Note  Patient: Sara Sellers  Procedure(s) Performed: Procedure(s): HERNIA REPAIR UMBILICAL ADULT (N/A)  Patient Location: PACU  Anesthesia Type:General  Level of Consciousness: awake and patient cooperative  Airway & Oxygen Therapy: Patient Spontanous Breathing and Patient connected to face mask oxygen  Post-op Assessment: Report given to PACU RN and Post -op Vital signs reviewed and stable  Post vital signs: Reviewed and stable  Complications: No apparent anesthesia complications

## 2013-01-23 NOTE — Op Note (Signed)
Preoperative diagnosis: Umbilical hernia Postoperative diagnosis: Same as above Procedure: Primary umbilical hernia repair Surgeon: Dr. Harden Mo Anesthesia: Gen. With LMA Specimens: None Estimated blood loss: Minimal Complications: None Drains: None Sponge and needle count was correct at completion Disposition to recovery stable  Indications: This is a 37 year old female who has had a long-standing history of an umbilical hernia. We discussed an umbilical hernia repair possibly with mesh including the risks and benefits associated with that.  Procedure: After informed consent was obtained the patient was taken to the operating room. She was placed under general anesthesia with an LMA. She was given 400 mg of ciprofloxacin due to her allergies. Sequential compression devices were placed on her legs. Her abdomen was then prepped and draped in the standard sterile surgical fashion. A surgical timeout was performed.  I made a curvilinear incision below her umbilicus. I carried this out down to her umbilical stalk. I encircled this with a Kelly clamp. I then divided this with cautery. There was some preperitoneal fat that was incarcerated. This was a less than 1 cm defect that was present after I reduced it. It came together very easily. I elected not to place mesh. I then closed this with multiple #1 Novafil sutures. This was done without any tension. Hemostasis was observed. I then tacked the umbilicus back down with 3-0 Vicryl. I then closed the dermis with 3-0 Vicryl and the skin with 4-0 Monocryl. I infiltrated Marcaine throughout the wound. I then placed Steri-Strips and a sterile dressing. She tolerated this well was extubated and transferred to recovery stable.

## 2013-01-23 NOTE — Anesthesia Procedure Notes (Signed)
Procedure Name: LMA Insertion Date/Time: 01/23/2013 7:36 AM Performed by: Dwain Sarna, MATTHEW Pre-anesthesia Checklist: Patient identified, Emergency Drugs available, Suction available and Patient being monitored Patient Re-evaluated:Patient Re-evaluated prior to inductionOxygen Delivery Method: Circle System Utilized Preoxygenation: Pre-oxygenation with 100% oxygen Intubation Type: IV induction Ventilation: Mask ventilation without difficulty LMA: LMA inserted LMA Size: 4.0 Number of attempts: 1 Airway Equipment and Method: bite block Placement Confirmation: positive ETCO2 Tube secured with: Tape Dental Injury: Teeth and Oropharynx as per pre-operative assessment

## 2013-01-24 ENCOUNTER — Encounter (HOSPITAL_BASED_OUTPATIENT_CLINIC_OR_DEPARTMENT_OTHER): Payer: Self-pay | Admitting: General Surgery

## 2013-01-30 ENCOUNTER — Encounter: Payer: Self-pay | Admitting: Certified Nurse Midwife

## 2013-01-30 ENCOUNTER — Encounter (INDEPENDENT_AMBULATORY_CARE_PROVIDER_SITE_OTHER): Payer: Self-pay

## 2013-02-20 ENCOUNTER — Encounter (INDEPENDENT_AMBULATORY_CARE_PROVIDER_SITE_OTHER): Payer: Self-pay | Admitting: General Surgery

## 2013-02-20 ENCOUNTER — Ambulatory Visit (INDEPENDENT_AMBULATORY_CARE_PROVIDER_SITE_OTHER): Payer: 59 | Admitting: General Surgery

## 2013-02-20 VITALS — BP 108/82 | HR 82 | Temp 98.9°F | Resp 16 | Ht 61.0 in | Wt 172.4 lb

## 2013-02-20 DIAGNOSIS — Z09 Encounter for follow-up examination after completed treatment for conditions other than malignant neoplasm: Secondary | ICD-10-CM

## 2013-02-20 NOTE — Progress Notes (Signed)
Subjective:     Patient ID: Sara Sellers, female   DOB: 26-Jul-1975, 37 y.o.   MRN: [id number]  HPI This is a 37 yo female who underwent primary repair of an umbilical hernia a little bit less than 4 weeks ago. She has done well. She still has some pain at the site but is otherwise without complaint.  Review of Systems     Objective:   Physical Exam Soreness around umbilicus, well healed scar, minimal edema    Assessment:     S/p umbilical hernia repair     Plan:     She is doing well. She has the expected postoperative course. She is going to begin massaging her incision. I think her being out of work until the planned return to the 23rd to allow this to be a little better to decrease her recurrence rate over the long term. I will plan on seeing her back as needed and after the 23rd she can return to full normal activity without any restrictions.

## 2013-11-13 ENCOUNTER — Ambulatory Visit: Payer: 59 | Admitting: Certified Nurse Midwife

## 2013-11-15 ENCOUNTER — Ambulatory Visit: Payer: 59 | Admitting: Certified Nurse Midwife

## 2013-11-30 ENCOUNTER — Ambulatory Visit (INDEPENDENT_AMBULATORY_CARE_PROVIDER_SITE_OTHER): Payer: 59 | Admitting: Neurology

## 2013-11-30 ENCOUNTER — Encounter: Payer: Self-pay | Admitting: Neurology

## 2013-11-30 VITALS — BP 113/80 | HR 67 | Ht 61.0 in | Wt 175.0 lb

## 2013-11-30 DIAGNOSIS — H47321 Drusen of optic disc, right eye: Secondary | ICD-10-CM

## 2013-11-30 DIAGNOSIS — G43009 Migraine without aura, not intractable, without status migrainosus: Secondary | ICD-10-CM

## 2013-11-30 DIAGNOSIS — Z823 Family history of stroke: Secondary | ICD-10-CM

## 2013-11-30 DIAGNOSIS — H47329 Drusen of optic disc, unspecified eye: Secondary | ICD-10-CM

## 2013-11-30 DIAGNOSIS — Z8249 Family history of ischemic heart disease and other diseases of the circulatory system: Secondary | ICD-10-CM | POA: Insufficient documentation

## 2013-11-30 MED ORDER — ELETRIPTAN HYDROBROMIDE 40 MG PO TABS: 40.0000 mg | ORAL_TABLET | ORAL | Status: DC | PRN

## 2013-11-30 NOTE — Progress Notes (Signed)
[facility] [address] [place]. [place] [address] [phone]       OFFICE CONSULT NOTE  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 The patient denied any lack of visual acuity, trouble reading, eye pain or significant worsening of headaches. She does have a long-standing history of migraine headaches but she has not noticed any change in them. She has severe migraines once or twice a month and mild headaches couple of times a week. Ibuprofen works well for minor headaches but not for migraines. She has tried Maxalt years ago but not recently. She does have some vision symptoms prior to her headaches. The usual triggers include strong scents and menses. She denies neurological symptoms accompanying her migraines. She had Humphrey visual field testing done in Dr. Irwin Brakeman office which did not demonstrate any and large blind spot. She denies any history suggestive of diplopia, vertigo, focal extremity weakness, numbness, gait balance problems or significant problems with fatigue or bladder urgency. She does have a family history of cerebral aneurysms with multiple family members being involved in her father's side. She had a brain MRI scan about 10 years ago but no imaging in recent years.  ROS:   14 system review of systems is positive for headache only and all other systems negative  PMH:  Past Medical History  Diagnosis Date  . Migraines   . Umbilical hernia 01/2013  . High triglycerides     Social History:  History   Social History  . Marital Status: Married    Spouse Name: N/A    Number of Children: 3  . Years of Education: AS   Occupational History  .  [employer]   Social History Main Topics  . Smoking status: Never Smoker   . Smokeless tobacco: Never Used  . Alcohol Use: No  . Drug Use: No  . Sexual Activity: Not on file   Other Topics Concern  . Not on file   Social History Narrative  . No narrative on file    Medications:   Current Outpatient Prescriptions on File Prior to Visit  Medication Sig Dispense Refill  . fenofibrate 160 MG tablet Take 160 mg by mouth daily.        No current facility-administered medications on file prior to visit.    Allergies:   Allergies  Allergen Reactions  . Amoxicillin Hives  . Septra [Bactrim] Hives    Physical Exam General: well developed, well nourished young Caucasian lady, seated, in no evident distress Head: head normocephalic and atraumatic. Orohparynx benign Neck: supple with no carotid or supraclavicular bruits Cardiovascular: regular rate and rhythm, no murmurs Musculoskeletal: no deformity Skin:  no rash/petichiae Vascular:  Normal pulses all extremities Filed Vitals:   11/30/13 0859  BP: 113/80  Pulse: 67    Neurologic Exam Mental Status: Awake and fully alert. Oriented to place and time. Recent and remote memory intact. Attention span, concentration and fund of knowledge appropriate. Mood and affect appropriate.  Cranial Nerves: Fundoscopic exam reveals sharp disc margins. There is slight blurring of the disc margins in the superior and nasal quadrants right greater than left but I see good  venous pulsations.. Pupils equal, briskly reactive to light but left more than right. No afferent pupillary defect noted. Good vision acuity bilaterally Extraocular movements full without nystagmus. Visual fields full to confrontation. Hearing intact. Facial sensation intact. Face, tongue, palate moves normally and symmetrically.  Motor: Normal bulk and tone. Normal strength in all tested extremity muscles. Sensory.: intact to touch and pinprick and vibratory.  Coordination: Rapid  alternating movements normal in all extremities. Finger-to-nose and heel-to-shin performed accurately bilaterally. Gait and Station: Arises from chair without difficulty. Stance is normal. Gait demonstrates normal stride length and balance . Able to heel, toe and tandem walk without difficulty.  Reflexes: 1+ and symmetric. Toes downgoing.    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 She has long-standing history of migraine headaches and a strong family history of cerebral aneurysms    PLAN: I had a long discussion with the patient regarding her subtle finding of right optic disc swelling with absence of any eye related symptoms-this may represent a small drusen . I doubt that this represents brain tumor or demyelinating disease but given family history of brain aneurysms  We will check brain MRI scan and MRA of the brain. I advised her to try Relpax for migraine headaches. She was advised to call me in case she started having worsening headaches, decreased vision or   other eye-related complaints. We will hold on planning spinal tap unless her headaches or vision symptoms get worse. Return for followup in 2 months.    Note: This document was prepared with digital dictation and possible smart phrase technology. Any transcriptional errors that result from this process are unintentional.

## 2013-11-30 NOTE — Patient Instructions (Signed)
I had a long discussion with the patient regarding her subtle finding of right optic disc swelling with absence of any eye related symptoms-this may represent a small drusen . I doubt that this represents brain tumor or demyelinating disease but given family history of brain aneurysms  We will check brain MRI scan and MRA of the brain. I advised her to try Relpax for migraine headaches. She was advised to call me in case she started having worsening headaches, decreased vision or   other eye-related complaints. We will hold on planning spinal tap unless her headaches or vision symptoms get worse. Return for followup in 2 months.  Migraine Headache A migraine headache is an intense, throbbing pain on one or both sides of your head. A migraine can last for 30 minutes to several hours. CAUSES  The exact cause of a migraine headache is not always known. However, a migraine may be caused when nerves in the brain become irritated and release chemicals that cause inflammation. This causes pain. Certain things may also trigger migraines, such as:  Alcohol.  Smoking.  Stress.  Menstruation.  Aged cheeses.  Foods or drinks that contain nitrates, glutamate, aspartame, or tyramine.  Lack of sleep.  Chocolate.  Caffeine.  Hunger.  Physical exertion.  Fatigue.  Medicines used to treat chest pain (nitroglycerine), birth control pills, estrogen, and some blood pressure medicines. SIGNS AND SYMPTOMS  Pain on one or both sides of your head.  Pulsating or throbbing pain.  Severe pain that prevents daily activities.  Pain that is aggravated by any physical activity.  Nausea, vomiting, or both.  Dizziness.  Pain with exposure to bright lights, loud noises, or activity.  General sensitivity to bright lights, loud noises, or smells. Before you get a migraine, you may get warning signs that a migraine is coming (aura). An aura may include:  Seeing flashing lights.  Seeing bright spots,  halos, or zigzag lines.  Having tunnel vision or blurred vision.  Having feelings of numbness or tingling.  Having trouble talking.  Having muscle weakness. DIAGNOSIS  A migraine headache is often diagnosed based on:  Symptoms.  Physical exam.  A CT scan or MRI of your head. These imaging tests cannot diagnose migraines, but they can help rule out other causes of headaches. TREATMENT Medicines may be given for pain and nausea. Medicines can also be given to help prevent recurrent migraines.  HOME CARE INSTRUCTIONS  Only take over-the-counter or prescription medicines for pain or discomfort as directed by your health care provider. The use of long-term narcotics is not recommended.  Lie down in a dark, quiet room when you have a migraine.  Keep a journal to find out what may trigger your migraine headaches. For example, write down:  What you eat and drink.  How much sleep you get.  Any change to your diet or medicines.  Limit alcohol consumption.  Quit smoking if you smoke.  Get 7-9 hours of sleep, or as recommended by your health care provider.  Limit stress.  Keep lights dim if bright lights bother you and make your migraines worse. SEEK IMMEDIATE MEDICAL CARE IF:   Your migraine becomes severe.  You have a fever.  You have a stiff neck.  You have vision loss.  You have muscular weakness or loss of muscle control.  You start losing your balance or have trouble walking.  You feel faint or pass out.  You have severe symptoms that are different from your first symptoms. MAKE  SURE YOU:   Understand these instructions.  Will watch your condition.  Will get help right away if you are not doing well or get worse. Document Released: 03/02/2005 Document Revised: 07/17/2013 Document Reviewed: 11/07/2012 [facility] Patient Information 2015 [place], [place]. This information is not intended to replace advice given to you by your health care provider. Make sure you  discuss any questions you have with your health care provider.

## 2013-12-13 ENCOUNTER — Ambulatory Visit (INDEPENDENT_AMBULATORY_CARE_PROVIDER_SITE_OTHER): Payer: 59

## 2013-12-13 ENCOUNTER — Other Ambulatory Visit: Payer: 59

## 2013-12-13 DIAGNOSIS — H47329 Drusen of optic disc, unspecified eye: Secondary | ICD-10-CM

## 2013-12-13 DIAGNOSIS — H47321 Drusen of optic disc, right eye: Secondary | ICD-10-CM

## 2013-12-13 DIAGNOSIS — G43009 Migraine without aura, not intractable, without status migrainosus: Secondary | ICD-10-CM

## 2013-12-13 DIAGNOSIS — Z8249 Family history of ischemic heart disease and other diseases of the circulatory system: Secondary | ICD-10-CM

## 2013-12-13 DIAGNOSIS — Z823 Family history of stroke: Secondary | ICD-10-CM

## 2013-12-13 MED ORDER — GADOPENTETATE DIMEGLUMINE 469.01 MG/ML IV SOLN: 17.0000 mL | Freq: Once | INTRAVENOUS | Status: AC | PRN

## 2014-01-07 ENCOUNTER — Encounter: Payer: Self-pay | Admitting: Neurology

## 2014-01-09 ENCOUNTER — Telehealth: Payer: Self-pay | Admitting: Neurology

## 2014-01-09 ENCOUNTER — Encounter: Payer: Self-pay | Admitting: Neurology

## 2014-01-09 NOTE — Telephone Encounter (Signed)
Patient calling about MRI & MRA results, informed patient that her MRA was normal per EPIC, patient stated that she seen on mychart that the MRI was abnormal and wanted to know what was going on, informed patient that Dr Pearlean BrownieSethi would have to address this with her. Patient verbalized understanding and will wait to hear from Dr Pearlean BrownieSethi.

## 2014-01-09 NOTE — Telephone Encounter (Signed)
Patient returning call regarding MRI results. °

## 2014-01-11 NOTE — Telephone Encounter (Signed)
I called patient and gave results of brain MRI showing nonspecific white matter hyperintensities-not unusual in migraine patients.

## 2014-01-15 ENCOUNTER — Encounter: Payer: Self-pay | Admitting: Neurology

## 2014-03-16 HISTORY — PX: SINUS SURGERY: SHX187

## 2014-04-02 ENCOUNTER — Encounter: Payer: Self-pay | Admitting: Neurology

## 2014-04-02 ENCOUNTER — Ambulatory Visit (INDEPENDENT_AMBULATORY_CARE_PROVIDER_SITE_OTHER): Payer: 59 | Admitting: Neurology

## 2014-04-02 VITALS — BP 119/82 | HR 81 | Ht 61.0 in | Wt 176.6 lb

## 2014-04-02 DIAGNOSIS — R9082 White matter disease, unspecified: Secondary | ICD-10-CM

## 2014-04-02 DIAGNOSIS — R930 Abnormal findings on diagnostic imaging of skull and head, not elsewhere classified: Secondary | ICD-10-CM

## 2014-04-02 NOTE — Progress Notes (Signed)
[facility] [address] [place]. [place] [id number] [phone]       OFFICE FOLLOW UP VISIT NOTE  [name] Date of Birth:  12/04/1975 Medical Record Number:  [id number]   Referring MD:  Despina AriasYen Le, OD  Reason for Referral:  Right eye optic disc swelling  Initial Consult 11/30/2013: 38 year resident Caucasian lady who has no eye complaints but during a routine eye exam was found to have moderate optic disc edema in the right eye and absent venous pulsations. The patient denied any lack of visual acuity, trouble reading, eye pain or significant worsening of headaches. She does have a long-standing history of migraine headaches but she has not noticed any change in them. She has severe migraines once or twice a month and mild headaches couple of times a week. Ibuprofen works well for minor headaches but not for migraines. She has tried Maxalt years ago but not recently. She does have some vision symptoms prior to her headaches. The usual triggers include strong scents and menses. She denies neurological symptoms accompanying her migraines. She had Humphrey visual field testing done in Dr. Irwin BrakemanLe's office which did not demonstrate any and large blind spot. She denies any history suggestive of diplopia, vertigo, focal extremity weakness, numbness, gait balance problems or significant problems with fatigue or bladder urgency. She does have a family history of cerebral aneurysms with multiple family members being involved in her father's side. She had a brain MRI scan about 10 years ago but no imaging in recent years. Update 04/02/2014 : [name] returns for follow-up after last visit 4 months ago. She continues to do well without significant worsening headaches or any eye symptoms. She in fact has had only 2 migraines and took a Relpax which helped the headaches but it makes her feel sleepy. She also had some transient chest wall pain. She has not had any follow-up appointment with an  eye doctor and has an appointment only in August this year. She has not gained any weight. She however admits she does not exercise regularly. She had MRI scan of the brain done on 12/13/2013 which have personally reviewed and shows tiny nonspecific white matter hyperintensities. MRA of the brain shows no significant stenosis of medium or large size intracranial vessels. Persistent fetal pattern of origin of the right posterior cerebral artery and hypoplastic terminal right vertebral artery benign variants. She denies any history of tick bite, rash, miscarriages, diabetes or hyperlipidemia. There is however family history of diabetes and hyperlipidemia. ROS:   14 system review of systems is positive for headache only and all other systems negative  PMH:  Past Medical History  Diagnosis Date  . Migraines   . Umbilical hernia 01/2013  . High triglycerides     Social History:  History   Social History  . Marital Status: Married    Spouse Name: N/A    Number of Children: 3  . Years of Education: AS   Occupational History  .  [employer]   Social History Main Topics  . Smoking status: Never Smoker   . Smokeless tobacco: Never Used  . Alcohol Use: No  . Drug Use: No  . Sexual Activity: Not on file   Other Topics Concern  . Not on file   Social History Narrative   Patient is married with 3 children.   Patient is right handed.   Patient has an Associates degree.   Patient drinks 2 cups daily.  Medications:   Current Outpatient Prescriptions on File Prior to Visit  Medication Sig Dispense Refill  . eletriptan (RELPAX) 40 MG tablet Take 1 tablet (40 mg total) by mouth as needed for migraine or headache. One tablet by mouth at onset of headache. May repeat in 2 hours if headache persists or recurs. 10 tablet 0   No current facility-administered medications on file prior to visit.    Allergies:   Allergies  Allergen Reactions  . Amoxicillin Hives  . Septra [Bactrim] Hives     Physical Exam General: well developed, well nourished young Caucasian lady, seated, in no evident distress Head: head normocephalic and atraumatic. Orohparynx benign Neck: supple with no carotid or supraclavicular bruits Cardiovascular: regular rate and rhythm, no murmurs Musculoskeletal: no deformity Skin:  no rash/petichiae Vascular:  Normal pulses all extremities Filed Vitals:   04/02/14 0817  BP: 119/82  Pulse: 81  Weight. 176.6 lbs  Neurologic Exam Mental Status: Awake and fully alert. Oriented to place and time. Recent and remote memory intact. Attention span, concentration and fund of knowledge appropriate. Mood and affect appropriate.  Cranial Nerves: Fundoscopic exam reveals sharp disc margins. There is slight blurring of the disc margins in the superior and nasal quadrants right greater than left but I see good venous pulsations.. Pupils equal, briskly reactive to light but left more than right. No afferent pupillary defect noted. Good vision acuity bilaterally Extraocular movements full without nystagmus. Visual fields full to confrontation. Hearing intact. Facial sensation intact. Face, tongue, palate moves normally and symmetrically.  Motor: Normal bulk and tone. Normal strength in all tested extremity muscles. Sensory.: intact to touch and pinprick and vibratory.  Coordination: Rapid alternating movements normal in all extremities. Finger-to-nose and heel-to-shin performed accurately bilaterally. Gait and Station: Arises from chair without difficulty. Stance is normal. Gait demonstrates normal stride length and balance . Able to heel, toe and tandem walk without difficulty.  Reflexes: 1+ and symmetric. Toes downgoing.    ASSESSMENT: 39 year old lady without any eye related complaints with subtle right optic disc swelling of unclear significance. 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 She has long-standing history of migraine headaches and  a strong family history of cerebral aneurysms. Recent brain MRI shows only nonspecific white matter hyperintensities and no aneurysms    PLAN: I had a long discussion with the patient with regards to her slight right optic disc swelling,personally reviewed findings of her brain MRI, differential diagnosis and answered questions. She is quite asymptomatic at the present time hence we'll not do further diagnostic neurological testing. She may be at risk for pseudotumor cerebri but clinically does not meet criteria at the present time. She was advised to diet, exercise regularly and avoid weight gain. Check basic lab works to evaluate nonspecific brain hyperintensities on MRI. Continue Relpax for migraines but decrease the dose to 20 mg as she is having some side effects. She was advised to call me in case she has worsening headaches or vision complaints. Otherwise return for follow-up in 6 months and will repeat brain MRI at that time.  Note: This document was prepared with digital dictation and possible smart phrase technology. Any transcriptional errors that result from this process are unintentional.

## 2014-04-02 NOTE — Patient Instructions (Addendum)
I had a long discussion with the patient with regards to her slight right optic disc swelling,personally reviewed findings of her brain MRI, differential diagnosis and answered questions. She is quite asymptomatic at the present time hence we'll not do further diagnostic neurological testing. She may be at risk for pseudotumor cerebri but clinically does not meet criteria at the present time. She was advised to diet, exercise regularly and avoid weight gain. Check basic lab works to evaluate nonspecific brain hyperintensities on MRI. Continue Relpax for migraines but decrease the dose to 20 mg as she is having some side effects. She was advised to call me in case she has worsening headaches or vision complaints. Otherwise return for follow-up in 6 months and will repeat brain MRI at that time.

## 2014-10-01 ENCOUNTER — Ambulatory Visit: Payer: 59 | Admitting: Neurology

## 2015-01-24 ENCOUNTER — Encounter: Payer: Self-pay | Admitting: Pediatrics

## 2015-01-24 ENCOUNTER — Ambulatory Visit (INDEPENDENT_AMBULATORY_CARE_PROVIDER_SITE_OTHER): Payer: 59 | Admitting: Pediatrics

## 2015-01-24 VITALS — BP 129/84 | HR 85 | Temp 97.6°F | Ht 61.0 in | Wt 176.8 lb

## 2015-01-24 DIAGNOSIS — Z6833 Body mass index (BMI) 33.0-33.9, adult: Secondary | ICD-10-CM

## 2015-01-24 DIAGNOSIS — Z0000 Encounter for general adult medical examination without abnormal findings: Secondary | ICD-10-CM

## 2015-01-24 NOTE — Progress Notes (Signed)
Subjective:    Patient ID: Sara Sellers, female    DOB: [birth year], 39 y.o.   MRN: [id number]  CC: complete PE  HPI: Sara Sellers is a 39 y.o. female presenting on 01/24/2015 for New Patient (Initial Visit)  Uncle recently passed from [place] at 73yo. Dad had MI in mid-50s, two brothers die at 74yo from [place] Works as resp Transport planner at [employer] Dad's mother and two sisters had hemorrhagic strokes from brain aneuryms H/o migraines, takes prn relpax. Has apprx 2 a month now, takes relpax apprx once a month. Does have visual auras with migraines. Last pap smear: 2 years ago 39yo, 39yo, and 39yo at home. H/o hypertriglyceridemia, was on fenofibrate in the past  No fam history of breast cancer or colon cancer. Mother had colon polyps and uterine cancer.  ROS: All systems negative other than what is in HPI  Past Medical History Patient Active Problem List   Diagnosis Date Noted  . BMI 33.0-33.9,adult 01/24/2015  . Migraine without aura, without mention of intractable migraine without mention of status migrainosus 11/30/2013  . 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 FH: cerebral aneurysm 11/30/2013   Family History  Problem Relation Age of Onset  . Diabetes Mother   . Uterine cancer Mother   . Hypertension Mother   . Hypertension Father   . Heart disease Father   . Diabetes Maternal Grandmother   . Hypothyroidism Maternal Grandmother   . Diabetes Maternal Grandfather   . Hypertension Maternal Grandfather   . Heart disease Maternal Grandfather   . Cancer Maternal Grandfather     lung w/ mets to bone  . Hyperlipidemia Brother   . Stroke Paternal Grandmother   . Cancer Paternal Grandmother   . Heart disease Paternal Grandfather   . Hyperlipidemia Brother   . Heart disease Maternal Aunt   . Early death Maternal Aunt   . Heart disease Maternal Uncle   . Early death Maternal Uncle   . Heart disease Paternal Aunt   . Heart disease Paternal Uncle    Social History   Social  History  . Marital Status: Married    Spouse Name: N/A  . Number of Children: 3  . Years of Education: AS   Occupational History  .  [employer]   Social History Main Topics  . Smoking status: Never Smoker   . Smokeless tobacco: Never Used  . Alcohol Use: No  . Drug Use: No  . Sexual Activity: Yes    Birth Control/ Protection: None   Other Topics Concern  . Not on file   Social History Narrative   Patient is married with 3 children.   Patient is right handed.   Patient has an Associates degree.   Patient drinks 2 cups daily.     Current Outpatient Prescriptions  Medication Sig Dispense Refill  . eletriptan (RELPAX) 40 MG tablet Take 1 tablet (40 mg total) by mouth as needed for migraine or headache. One tablet by mouth at onset of headache. May repeat in 2 hours if headache persists or recurs. 10 tablet 0   No current facility-administered medications for this visit.       Objective:    BP 129/84 mmHg  Pulse 85  Temp(Src) 97.6 F (36.4 C) (Oral)  Ht '5\' 1"'  (1.549 m)  Wt 176 lb 12.8 oz (80.196 kg)  BMI 33.42 kg/m2  Wt Readings from Last 3 Encounters:  01/24/15 176 lb 12.8 oz (80.196 kg)  04/02/14 176 lb 9.6 oz (80.105 kg)  11/30/13 175 lb (79.379 kg)    Gen: NAD, alert, cooperative with exam, NCAT EYES: EOMI, no scleral injection or icterus ENT:  TMs pearly gray b/l, OP with mild erythema LYMPH: no cervical LAD CV: NRRR, normal S1/S2, no murmur, distal pulses 2+ b/l Resp: CTABL, no wheezes, normal WOB Abd: +BS, soft, NTND. no guarding or organomegaly Ext: No edema, warm Neuro: Alert and oriented, strength equal b/l UE and LE, coordination grossly normal MSK: normal muscle bulk     Assessment & Plan:   Sara Sellers was seen today for CPE.  Diagnoses and all orders for this visit:  Encounter for preventive health examination -     Lipid panel; Future -     BMP8+EGFR; Future  BMI 33.0-33.9,adult Discussed diet and exercise changes, healthy  lifestyle.   Follow up plan: Return for labs within next week, CPE in 1 year.  Assunta Found, MD [facility] 01/24/2015, 11:04 AM

## 2015-01-31 ENCOUNTER — Ambulatory Visit (INDEPENDENT_AMBULATORY_CARE_PROVIDER_SITE_OTHER): Payer: 59 | Admitting: Pediatrics

## 2015-01-31 ENCOUNTER — Encounter: Payer: Self-pay | Admitting: Pediatrics

## 2015-01-31 VITALS — BP 115/88 | HR 70 | Temp 97.3°F | Ht 61.0 in | Wt 179.0 lb

## 2015-01-31 DIAGNOSIS — Z0000 Encounter for general adult medical examination without abnormal findings: Secondary | ICD-10-CM

## 2015-01-31 DIAGNOSIS — J019 Acute sinusitis, unspecified: Secondary | ICD-10-CM | POA: Diagnosis not present

## 2015-01-31 DIAGNOSIS — H66011 Acute suppurative otitis media with spontaneous rupture of ear drum, right ear: Secondary | ICD-10-CM

## 2015-01-31 MED ORDER — DOXYCYCLINE HYCLATE 100 MG PO TABS: 100.0000 mg | ORAL_TABLET | Freq: Two times a day (BID) | ORAL | Status: DC

## 2015-01-31 NOTE — Progress Notes (Signed)
    Subjective:    Patient ID: Sara Sellers, female    DOB: 05/11/1975, 39 y.o.   MRN: [id number]  CC: uri sx  HPI: Sara AuMary S Heuer is a 39 y.o. female presenting on 01/31/2015 for Cough and sinus pressure  About a week ago started having sore throat. For past 3-4 days having yellow drainage out of R ear, popping of ear Some R ear pain Lots of yellow drainage out of sinuses No fevers Appetite down No abd pain/nausea or vomiting No SOB  Relevant past medical, surgical, family and social history reviewed and updated as indicated. Interim medical history since our last visit reviewed. Allergies and medications reviewed and updated.   ROS: Per HPI unless specifically indicated above  Past Medical History Patient Active Problem List   Diagnosis Date Noted  . BMI 33.0-33.9,adult 01/24/2015  . Migraine without aura, without mention of intractable migraine without mention of status migrainosus 11/30/2013  . 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 FH: cerebral aneurysm 11/30/2013    Current Outpatient Prescriptions  Medication Sig Dispense Refill  . doxycycline (VIBRA-TABS) 100 MG tablet Take 1 tablet (100 mg total) by mouth 2 (two) times daily. 20 tablet 0  . eletriptan (RELPAX) 40 MG tablet Take 1 tablet (40 mg total) by mouth as needed for migraine or headache. One tablet by mouth at onset of headache. May repeat in 2 hours if headache persists or recurs. 10 tablet 0   No current facility-administered medications for this visit.       Objective:    BP 115/88 mmHg  Pulse 70  Temp(Src) 97.3 F (36.3 C) (Oral)  Ht 5\' 1"  (1.549 m)  Wt 179 lb (81.194 kg)  BMI 33.84 kg/m2  Wt Readings from Last 3 Encounters:  01/31/15 179 lb (81.194 kg)  01/24/15 176 lb 12.8 oz (80.196 kg)  04/02/14 176 lb 9.6 oz (80.105 kg)    Gen: NAD, alert, cooperative with exam, NCAT EYES: EOMI, no scleral injection or icterus ENT:  L TM slightly erythematous, R TM with possible perforation visualized  central area, some fluid behind membrane, yellow discharge in canal, crustin gon ear. OP with erythema LYMPH: no cervical LAD CV: NRRR, normal S1/S2, no murmur, distal pulses 2+ b/l Resp: CTABL, no wheezes, normal WOB Abd: +BS, soft, NTND. no guarding or organomegaly Ext: No edema, warm Neuro: Alert and oriented, strength equal b/l UE and LE, coordination grossly normal MSK: normal muscle bulk     Assessment & Plan:   [name] was seen today for cough and sinus pressure.  Diagnoses and all orders for this visit:  Acute sinusitis, recurrence not specified, unspecified location -     doxycycline (VIBRA-TABS) 100 MG tablet; Take 1 tablet (100 mg total) by mouth 2 (two) times daily.  Acute suppurative otitis media of right ear with spontaneous rupture of tympanic membrane, recurrence not specified Above abx should cover ear infection, will let me know if not improving.   Follow up plan: Return if symptoms worsen or fail to improve.  Rex Krasarol Vincent, MD [facility] 01/31/2015, 8:01 AM

## 2015-02-01 LAB — LIPID PANEL
CHOLESTEROL TOTAL: 191 mg/dL (ref 100–199)
Chol/HDL Ratio: 6.4 ratio units — ABNORMAL HIGH (ref 0.0–4.4)
HDL: 30 mg/dL — ABNORMAL LOW (ref >39)
LDL CALC: 100 mg/dL — AB (ref 0–99)
TRIGLYCERIDES: 305 mg/dL — AB (ref 0–149)
VLDL Cholesterol Cal: 61 mg/dL — ABNORMAL HIGH (ref 5–40)

## 2015-02-01 LAB — BMP8+EGFR
BUN/Creatinine Ratio: 10 (ref 8–20)
BUN: 9 mg/dL (ref 6–20)
CO2: 25 mmol/L (ref 18–29)
Calcium: 9 mg/dL (ref 8.7–10.2)
Chloride: 100 mmol/L (ref 97–106)
Creatinine, Ser: 0.9 mg/dL (ref 0.57–1.00)
GFR, EST AFRICAN AMERICAN: 94 mL/min/{1.73_m2} (ref >59)
GFR, EST NON AFRICAN AMERICAN: 81 mL/min/{1.73_m2} (ref >59)
Glucose: 88 mg/dL (ref 65–99)
POTASSIUM: 4.5 mmol/L (ref 3.5–5.2)
Sodium: 139 mmol/L (ref 136–144)

## 2015-02-06 ENCOUNTER — Encounter: Payer: Self-pay | Admitting: Pediatrics

## 2015-02-26 ENCOUNTER — Encounter: Payer: Self-pay | Admitting: Pediatrics

## 2015-02-28 ENCOUNTER — Ambulatory Visit (INDEPENDENT_AMBULATORY_CARE_PROVIDER_SITE_OTHER): Payer: 59 | Admitting: Pediatrics

## 2015-02-28 VITALS — BP 129/95 | HR 81 | Temp 97.6°F | Ht 61.0 in | Wt 182.0 lb

## 2015-02-28 DIAGNOSIS — J309 Allergic rhinitis, unspecified: Secondary | ICD-10-CM

## 2015-03-02 ENCOUNTER — Encounter: Payer: Self-pay | Admitting: Pediatrics

## 2015-03-02 NOTE — Progress Notes (Signed)
    Subjective:    Patient ID: Sara Sellers, female    DOB: 08/17/1975, 39 y.o.   MRN: [id number]  CC: recheck ears  HPI: Sara AuMary S Sellers is a 39 y.o. female presenting for recheck ears.  Last visit several weeks ago was found to have a perforated R sided TM with acute AOM Treated with antibiotics Now still with ear popping, feels "different" No fevers No pain in ear URI symptoms have resolved though now pt coughing more, thinks she has bronchitis  Relevant past medical, surgical, family and social history reviewed and updated as indicated. Interim medical history since our last visit reviewed. Allergies and medications reviewed and updated.    ROS: Per HPI unless specifically indicated above  History  Smoking status  . Never Smoker   Smokeless tobacco  . Never Used    Past Medical History Patient Active Problem List   Diagnosis Date Noted  . BMI 33.0-33.9,adult 01/24/2015  . Migraine without aura, without mention of intractable migraine without mention of status migrainosus 11/30/2013  . 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 FH: cerebral aneurysm 11/30/2013      Objective:    BP 129/95 mmHg  Pulse 81  Temp(Src) 97.6 F (36.4 C) (Oral)  Ht 5\' 1"  (1.549 m)  Wt 182 lb (82.555 kg)  BMI 34.41 kg/m2  Wt Readings from Last 3 Encounters:  02/28/15 182 lb (82.555 kg)  01/31/15 179 lb (81.194 kg)  01/24/15 176 lb 12.8 oz (80.196 kg)    Gen: NAD, alert, cooperative with exam, NCAT EYES: EOMI, no scleral injection or icterus ENT:  R TM no perforation, does have some fluid behind R TM, otherwise does not appear infected. L TM normal. OP without erythema LYMPH: no cervical LAD CV: NRRR, normal S1/S2, no murmur, distal pulses 2+ b/l Resp: CTABL, no wheezes, normal WOB Abd: +BS, soft, NTND. no guarding or organomegaly Ext: No edema, warm Neuro: Alert and oriented, strength equal b/l UE and LE, coordination grossly normal MSK: normal muscle bulk     Assessment & Plan:    [name] was seen today for ear problems, perforated TM has closed. Still with fluid behind TM. Rec try flonase and zyrtec. Let me know if not resolving.  Diagnoses and all orders for this visit:  Allergic rhinitis, unspecified allergic rhinitis type    Follow up plan: As needed  Rex Krasarol Kito Cuffe, MD [facility]

## 2015-03-07 ENCOUNTER — Ambulatory Visit: Payer: 59 | Admitting: Pediatrics

## 2015-05-28 ENCOUNTER — Encounter: Payer: Self-pay | Admitting: Pediatrics

## 2015-05-29 NOTE — Telephone Encounter (Signed)
Called and talked with mom. Still with same R pinky swollen from yesterday, knees are red. No new swelling or symptoms that she knows of. Will continue current plan, benadryl, cetirizine for hives, ibuprofen for swelling, let me know if worsens.

## 2015-06-24 ENCOUNTER — Encounter: Payer: Self-pay | Admitting: Family Medicine

## 2015-06-24 ENCOUNTER — Ambulatory Visit (INDEPENDENT_AMBULATORY_CARE_PROVIDER_SITE_OTHER): Payer: 59 | Admitting: Family Medicine

## 2015-06-24 ENCOUNTER — Ambulatory Visit (INDEPENDENT_AMBULATORY_CARE_PROVIDER_SITE_OTHER): Payer: 59

## 2015-06-24 VITALS — BP 125/84 | HR 69 | Temp 97.7°F | Ht 61.0 in | Wt 180.6 lb

## 2015-06-24 DIAGNOSIS — M25531 Pain in right wrist: Secondary | ICD-10-CM | POA: Diagnosis not present

## 2015-06-24 DIAGNOSIS — G5601 Carpal tunnel syndrome, right upper limb: Secondary | ICD-10-CM

## 2015-06-24 MED ORDER — PREDNISONE 10 MG PO TABS: ORAL_TABLET | ORAL | Status: DC

## 2015-06-24 NOTE — Progress Notes (Signed)
Subjective:  Patient ID: Sara Sellers, female    DOB: 05/26/1975  Age: 40 y.o. MRN: [id number]  CC: Wrist Pain   HPI Sara Sellers presents for 3 weks increasing pain Right wrist. Not numb. 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 Yesterday had weakness and pain at wrist playing in church.    History Sara Sellers has a past medical history of Migraines; Umbilical hernia (01/2013); and High triglycerides.   She has past surgical history that includes Dilation and curettage of uterus (01/09/2004); Umbilical hernia repair (N/A, 01/23/2013); Hernia repair (01/23/2013); and Hysteroscopy with novasure.   Her family history includes Cancer in her maternal grandfather and paternal grandmother; Diabetes in her maternal grandfather, maternal grandmother, and mother; Early death in her maternal aunt and maternal uncle; Heart disease in her father, maternal aunt, maternal grandfather, maternal uncle, paternal aunt, paternal grandfather, and paternal uncle; Hyperlipidemia in her brother and brother; Hypertension in her father, maternal grandfather, and mother; Hypothyroidism in her maternal grandmother; Stroke in her paternal grandmother; Uterine cancer in her mother.She reports that she has never smoked. She has never used smokeless tobacco. She reports that she does not drink alcohol or use illicit drugs.    ROS Review of Systems  Constitutional: Negative for fever and activity change.  HENT: Positive for rhinorrhea.   Respiratory: Positive for shortness of breath.   Genitourinary: Negative for dysuria.  Musculoskeletal: Positive for myalgias, joint swelling and arthralgias.    Objective:  BP 125/84 mmHg  Pulse 69  Temp(Src) 97.7 F (36.5 C) (Oral)  Ht 5\' 1"  (1.549 m)  Wt 180 lb 9.6 oz (81.92 kg)  BMI 34.14 kg/m2  SpO2 99%  BP Readings from Last 3 Encounters:  06/24/15 125/84  02/28/15 129/95  01/31/15 115/88    Wt Readings from Last 3 Encounters:  06/24/15 180 lb 9.6 oz  (81.92 kg)  02/28/15 182 lb (82.555 kg)  01/31/15 179 lb (81.194 kg)     Physical Exam  Constitutional: She appears well-developed and well-nourished.  HENT:  Head: Normocephalic.  Cardiovascular: Normal rate and regular rhythm.   No murmur heard. Pulmonary/Chest: Effort normal and breath sounds normal.  Musculoskeletal: She exhibits tenderness (right wrist at ulnar aspect. Phalen + on right for tingling, numbness).     Lab Results  Component Value Date   WBC 4.2 01/20/2013   HGB 13.8 01/20/2013   HCT 38.1 01/20/2013   PLT 286 01/20/2013   GLUCOSE 88 01/31/2015   CHOL 191 01/31/2015   TRIG 305* 01/31/2015   HDL 30* 01/31/2015   LDLCALC 100* 01/31/2015   NA 139 01/31/2015   K 4.5 01/31/2015   CL 100 01/31/2015   CREATININE 0.90 01/31/2015   BUN 9 01/31/2015   CO2 25 01/31/2015    No results found.  Assessment & Plan:   Sara Sellers was seen today for wrist pain.  Diagnoses and all orders for this visit:  Right wrist pain -     DG Wrist Complete Right -     Wrist splint  Carpal tunnel syndrome of right wrist -     Wrist splint  Other orders -     predniSONE (DELTASONE) 10 MG tablet; Take 5 daily for 3 days followed by 4,3,2 and 1 for 3 days each.    Wear spint qhs. Increase to 24/7 if sx continue after 2 weeks  I have discontinued Ms. Ferdinand's doxycycline. I am also having her start on predniSONE. Additionally, I am having her maintain  her eletriptan.  Meds ordered this encounter  Medications  . predniSONE (DELTASONE) 10 MG tablet    Sig: Take 5 daily for 3 days followed by 4,3,2 and 1 for 3 days each.    Dispense:  45 tablet    Refill:  0     Follow-up: Return in about 6 weeks (around 08/05/2015) for Pain.  Mechele Claude, M.D.

## 2016-03-30 ENCOUNTER — Encounter: Payer: Self-pay | Admitting: Family Medicine

## 2016-03-30 ENCOUNTER — Ambulatory Visit (INDEPENDENT_AMBULATORY_CARE_PROVIDER_SITE_OTHER): Payer: 59 | Admitting: Family Medicine

## 2016-03-30 VITALS — BP 117/84 | HR 75 | Temp 97.7°F | Ht 61.0 in | Wt 184.6 lb

## 2016-03-30 DIAGNOSIS — R0789 Other chest pain: Secondary | ICD-10-CM

## 2016-03-30 NOTE — Progress Notes (Signed)
BP 117/84   Pulse 75   Temp 97.7 F (36.5 C) (Oral)   Ht '5\' 1"'  (4.081 m)   Wt 184 lb 9.6 oz (83.7 kg)   BMI 34.88 kg/m    Subjective:    Patient ID: Sara Sellers, female    DOB: 1975-04-07, 41 y.o.   MRN: [id number]  HPI: Sara Sellers is a 41 y.o. female presenting on 03/30/2016 for Shortness of Breath (since Friday, can do anything & gets SOB) and Chest Pain (in center on Friday does not radiate, if moved got worse)   HPI Exertional shortness of breath and chest pain Patient has been having shortness of breath on exertion and chest pressure that started Friday after coming home from work. The chest pain/pressure described as sharp and substernal. The pain does not radiate anywhere else. She has not had any cough or wheezing or fevers or chills but has had some body aches but has been working very visibly and having to go around the hospital a lot recently because they are busy. 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 She denies any sore throat or congestion or ear pressure. She does have a very extensive family history of cardiac abnormalities along with diabetes. She says that she gets somewhat short of breath from walking short distances across the room and she was not like that before. The pain or pressure in the center of her chest is worse with rotational movements. The exertional shortness of breath has been going on and off since she had a pneumonia about a year ago, may be related to reactive airway disease or scarring from the pneumonia. She uses the occasional albuterol for that  Relevant past medical, surgical, family and social history reviewed and updated as indicated. Interim medical history since our last visit reviewed. Allergies and medications reviewed and updated.  Review of Systems  Constitutional: Negative for chills and fever.  Respiratory: Positive for chest tightness. Negative for shortness of breath.   Cardiovascular: Positive for chest pain.  Negative for palpitations and leg swelling.  Genitourinary: Negative for difficulty urinating and dysuria.  Musculoskeletal: Negative for back pain and gait problem.  Skin: Negative for rash.  Neurological: Negative for light-headedness and headaches.  Psychiatric/Behavioral: Negative for agitation and behavioral problems.  All other systems reviewed and are negative.  Per HPI unless specifically indicated above     Objective:    BP 117/84   Pulse 75   Temp 97.7 F (36.5 C) (Oral)   Ht '5\' 1"'  (1.549 m)   Wt 184 lb 9.6 oz (83.7 kg)   BMI 34.88 kg/m   Wt Readings from Last 3 Encounters:  03/30/16 184 lb 9.6 oz (83.7 kg)  06/24/15 180 lb 9.6 oz (81.9 kg)  02/28/15 182 lb (82.6 kg)    Physical Exam  Constitutional: She is oriented to person, place, and time. She appears well-developed and well-nourished. No distress.  Eyes: Conjunctivae are normal.  Cardiovascular: Normal rate, regular rhythm, normal heart sounds and intact distal pulses.   No murmur heard. Pulmonary/Chest: Effort normal and breath sounds normal. No respiratory distress. She has no wheezes. She has no rales. She exhibits tenderness (small amount of substernal tenderness).  Musculoskeletal: Normal range of motion. She exhibits no edema or tenderness.  Neurological: She is alert and oriented to person, place, and time. Coordination normal.  Skin: Skin is warm and dry. No rash noted. She is not diaphoretic.  Psychiatric: She has a normal mood and  affect. Her behavior is normal.  Nursing note and vitals reviewed.  EKG: Normal sinus rhythm    Assessment & Plan:   Problem List Items Addressed This Visit    None    Visit Diagnoses    Other chest pain    -  Primary   Likely chest wall pain and/or costochondritis   Relevant Orders   EKG 12-Lead (Completed)   CBC with Differential/Platelet   TSH   CMP14+EGFR       Follow up plan: Return if symptoms worsen or fail to improve.  Counseling provided for all of  the vaccine components Orders Placed This Encounter  Procedures  . CBC with Differential/Platelet  . TSH  . CMP14+EGFR  . EKG 12-Lead    Caryl Pina, MD [facility] 03/30/2016, 12:02 PM

## 2016-03-31 LAB — TSH: TSH: 2.03 u[IU]/mL (ref 0.450–4.500)

## 2016-03-31 LAB — CMP14+EGFR
A/G RATIO: 1.8 (ref 1.2–2.2)
ALT: 24 IU/L (ref 0–32)
AST: 18 IU/L (ref 0–40)
Albumin: 4.6 g/dL (ref 3.5–5.5)
Alkaline Phosphatase: 93 IU/L (ref 39–117)
BUN/Creatinine Ratio: 16 (ref 9–23)
BUN: 14 mg/dL (ref 6–20)
Bilirubin Total: 0.3 mg/dL (ref 0.0–1.2)
CO2: 24 mmol/L (ref 18–29)
Calcium: 9 mg/dL (ref 8.7–10.2)
Chloride: 101 mmol/L (ref 96–106)
Creatinine, Ser: 0.88 mg/dL (ref 0.57–1.00)
GFR calc Af Amer: 96 mL/min/{1.73_m2} (ref >59)
GFR calc non Af Amer: 83 mL/min/{1.73_m2} (ref >59)
GLOBULIN, TOTAL: 2.5 g/dL (ref 1.5–4.5)
Glucose: 79 mg/dL (ref 65–99)
POTASSIUM: 4.1 mmol/L (ref 3.5–5.2)
SODIUM: 141 mmol/L (ref 134–144)
Total Protein: 7.1 g/dL (ref 6.0–8.5)

## 2016-03-31 LAB — CBC WITH DIFFERENTIAL/PLATELET
BASOS ABS: 0 10*3/uL (ref 0.0–0.2)
Basos: 0 %
EOS (ABSOLUTE): 0.1 10*3/uL (ref 0.0–0.4)
Eos: 1 %
Hematocrit: 41.7 % (ref 34.0–46.6)
Hemoglobin: 14.7 g/dL (ref 11.1–15.9)
IMMATURE GRANS (ABS): 0 10*3/uL (ref 0.0–0.1)
IMMATURE GRANULOCYTES: 0 %
Lymphocytes Absolute: 1.9 10*3/uL (ref 0.7–3.1)
Lymphs: 35 %
MCH: 30.3 pg (ref 26.6–33.0)
MCHC: 35.3 g/dL (ref 31.5–35.7)
MCV: 86 fL (ref 79–97)
Monocytes Absolute: 0.4 10*3/uL (ref 0.1–0.9)
Monocytes: 8 %
NEUTROS ABS: 3 10*3/uL (ref 1.4–7.0)
NEUTROS PCT: 56 %
PLATELETS: 285 10*3/uL (ref 150–379)
RBC: 4.85 x10E6/uL (ref 3.77–5.28)
RDW: 12.5 % (ref 12.3–15.4)
WBC: 5.4 10*3/uL (ref 3.4–10.8)

## 2016-10-07 ENCOUNTER — Encounter (INDEPENDENT_AMBULATORY_CARE_PROVIDER_SITE_OTHER): Payer: Self-pay | Admitting: Urology

## 2016-10-07 ENCOUNTER — Ambulatory Visit (INDEPENDENT_AMBULATORY_CARE_PROVIDER_SITE_OTHER): Payer: Commercial Managed Care - POS | Admitting: Urology

## 2016-10-07 DIAGNOSIS — R351 Nocturia: Secondary | ICD-10-CM

## 2016-10-07 DIAGNOSIS — N393 Stress incontinence (female) (male): Secondary | ICD-10-CM | POA: Insufficient documentation

## 2016-10-07 LAB — URINALYSIS POC
POCT Urine Bilirubin: NEGATIVE
POCT Urine Glucose: NEGATIVE mg/dL
POCT Urine Ketones: NEGATIVE mg/dL
POCT Urine Nitrites: NEGATIVE
POCT Urine Urobilibogen: 0.2 mg/dL (ref 0.0–1.0)
POCT Urine pH: 6 (ref 5.0–8.0)
Protein, UR POCT: NEGATIVE mg/dL
Urine Specific Gravity POC: 1.02 (ref 1.001–1.035)
Urine leukocyte Esterase, POCT: NEGATIVE

## 2016-10-07 NOTE — Progress Notes (Signed)
Subjective:      Patient ID: Amanda Compton is a 41 y.o. female     Chief Complaint:  Pt has nocturia 1-3 times a night and feels as though she does not get a good nights sleep. Pt can have symptoms of double voiding intermittently.   No dysuria, no hematuria   During the day pt urinates infrequently during the day can go 3-5 hours  Pt has tried oxybutynin in the past for a week and didn't notice improvement.   Mild sleep apnea     +regular bowel movements  Reg gyn appts    History of UTIs as a child but patient doesn't remember  +regular periods  +sexually active with husband no contraception currently   1-vaginal delivery @ 41 years old  Pt has leakage of urine with full bladder only small amounts and worse with deep cough, no problems with exercise. Wears pads daily     Pt is a pharmacist     The following portions of the patient's history were reviewed and updated as appropriate: allergies, current medications, past family history, past medical history, past social history, past surgical history and problem list.    Review of Systems  Systems reviewed per the HPI and below:     History obtained from the patient     General ROS: Pt otherwise feeling well, no recent illness.       Ophthalmic ROS: negative for blurry vision or yellowing of the eyes     Allergy and Immunology ROS: known/unknown allergies as described by the patient     Hematological and Lymphatic ROS: No known bleeding/clotting disorders     Endocrine ROS: no significant hot/cold spells     Respiratory ROS: no cough, shortness of breath, or wheezing     Cardiovascular ROS: no chest pain or dyspnea on exertion     Gastrointestinal ROS: no abdominal pain, change in bowel habits     Musculoskeletal ROS:  no swelling to lower extremities, no back pain     Neurological ROS: no focal weakness     Dermatological ROS: no new rashes or lesions     Objective:   BP 129/85   Pulse 61   Ht 1.626 m (5\' 4" )   Wt 86.2 kg (190 lb)   BMI 32.61 kg/m    vital signs  reviewed  Physical Exam   Constitutional:  Well-developed, well-nourished, and in no distress.   Cardiovascular: Normal rate.   Pulmonary/Chest: Effort normal.   Abdominal: Soft. Pt exhibits no distension and no mass. There is no tenderness. There is no rebound and no guarding.   Musculoskeletal: Normal range of motion.   Neurological: Pt is alert and oriented to person, place, and time.   Skin: Skin is warm and dry.   Psychiatric: Mood, memory, affect and judgment normal.         Lab Review   Reviewed results as listed below. Discussed findings with patient.   Urine analysis shows trace blood, neg LE         Assessment:     1. Nocturia    2. Stress incontinence in female        Start with conservative management for now with bladder retraining and pelvic floor pt therapy.   If not improving will plan for anticholinergics     Plan:   Patient Instructions   1. Timed voiding (void every 2-3 hours even if you don't feel like you have to go during the day)  2. Start taking probiotics (culturelle, align, [employer] brand, any brand is fine)  3. Please write down what you had to eat, drink, sex, exercise 24 hours prior to onset of bladder pain. This can be done with a voiding irritant journal in paper or app format (try to find a pattern for when you bladder pain is worse, ie. Sex, exercise, foods, alcohol)  4. Drink enough water in the morning to keep urine clear or light yellow by lunchtime. Then drink when thirsty in the afternoon and evening.   5. Core exercises  6. Pelvic floor therapist     --------------  I appreciate the opportunity to help take care of you and your family. Here is some basic housekeeping:    1) Please be aware that I often send results/next steps via MyChart. Please check your account regularly.  2) There are some results that take up to 10 business days to be received and/or processed. If you have not heard from me regarding results in 7-10 business days please feel free to call [phone]) or send  a message via MyChart.   3) If you have surgical questions please call Stefanie for surgical planning and follow-up [phone]            Orders  Orders Placed This Encounter   Procedures   . Urinalysis POC

## 2016-10-07 NOTE — Patient Instructions (Signed)
1. Timed voiding (void every 2-3 hours even if you don't feel like you have to go during the day)  2. Start taking probiotics (culturelle, align, [employer] brand, any brand is fine)  3. Please write down what you had to eat, drink, sex, exercise 24 hours prior to onset of bladder pain. This can be done with a voiding irritant journal in paper or app format (try to find a pattern for when you bladder pain is worse, ie. Sex, exercise, foods, alcohol)  4. Drink enough water in the morning to keep urine clear or light yellow by lunchtime. Then drink when thirsty in the afternoon and evening.   5. Core exercises  6. Pelvic floor therapist     --------------  I appreciate the opportunity to help take care of you and your family. Here is some basic housekeeping:    1) Please be aware that I often send results/next steps via MyChart. Please check your account regularly.  2) There are some results that take up to 10 business days to be received and/or processed. If you have not heard from me regarding results in 7-10 business days please feel free to call [phone]) or send a message via MyChart.   3) If you have surgical questions please call Stefanie for surgical planning and follow-up [phone]

## 2016-11-04 ENCOUNTER — Encounter: Payer: 59 | Admitting: Pediatrics

## 2016-12-30 ENCOUNTER — Encounter: Payer: 59 | Admitting: Pediatrics

## 2017-01-08 ENCOUNTER — Ambulatory Visit (INDEPENDENT_AMBULATORY_CARE_PROVIDER_SITE_OTHER): Payer: Commercial Managed Care - POS | Admitting: Urology

## 2017-01-20 ENCOUNTER — Ambulatory Visit (INDEPENDENT_AMBULATORY_CARE_PROVIDER_SITE_OTHER): Payer: 59 | Admitting: Pediatrics

## 2017-01-20 ENCOUNTER — Encounter: Payer: Self-pay | Admitting: Pediatrics

## 2017-01-20 VITALS — BP 118/80 | HR 69 | Temp 98.5°F | Ht 61.0 in | Wt 169.0 lb

## 2017-01-20 DIAGNOSIS — Z0000 Encounter for general adult medical examination without abnormal findings: Secondary | ICD-10-CM

## 2017-01-20 DIAGNOSIS — G43809 Other migraine, not intractable, without status migrainosus: Secondary | ICD-10-CM

## 2017-01-20 NOTE — Progress Notes (Addendum)
  Subjective:   Patient ID: Sara Sellers, female    DOB: 12/19/1975, 41 y.o.   MRN: [id number] CC: Annual Exam and Gynecologic Exam  HPI: Sara Sellers is a 41 y.o. female presenting for Annual Exam and Gynecologic Exam  Having some URI symptoms Coughing some, non-productive No fevers  Walking more, low carb diet Has been pleased with weight loss Goal is 140 lbs  Migraines about once a month Well controlled  No vaginal discharge, no new partners, declines STI testing today  Relevant past medical, surgical, family and social history reviewed. Allergies and medications reviewed and updated. Social History   Tobacco Use  Smoking Status Never Smoker  Smokeless Tobacco Never Used   ROS: All systems negative other than what is in HPI  Objective:    BP 118/80   Pulse 69   Temp 98.5 F (36.9 C) (Oral)   Ht 5\' 1"  (1.549 m)   Wt 169 lb (76.7 kg)   BMI 31.93 kg/m   Wt Readings from Last 3 Encounters:  01/20/17 169 lb (76.7 kg)  03/30/16 184 lb 9.6 oz (83.7 kg)  06/24/15 180 lb 9.6 oz (81.9 kg)    Chaperone present throughout exam  Gen: NAD, alert, cooperative with exam, NCAT EYES: EOMI, no conjunctival injection, or no icterus ENT:  L TM with small amount white-clear effusion, R TM slightly pink, OP with mild erythema LYMPH: no cervical LAD CV: NRRR, normal S1/S2, no murmur, distal pulses 2+ b/l Resp: CTABL, no wheezes, normal WOB Abd: +BS, soft, NTND. no guarding or organomegaly Ext: No edema, warm Neuro: Alert and oriented, strength equal b/l UE and LE, coordination grossly normal MSK: normal muscle bulk Breast: nl b/l GU: normal external female genitalia, small amount white discharge present in vault  Assessment & Plan:  Sara Sellers was seen today for annual exam and gynecologic exam.  Diagnoses and all orders for this visit:  Encounter for preventive care -     Pap IG and HPV (high risk) DNA detection -     Lipid panel  BMI 31 Cont lifestyle changes, congratulated  on progress so far  Other migraine without status migrainosus, not intractable Stable, apprx 1x a month, cont abortive med prn  Follow up plan: Return in about 1 year (around 01/20/2018). Rex Krasarol Ayona Yniguez, MD [facility]

## 2017-01-21 LAB — PAP IG AND HPV HIGH-RISK
HPV, HIGH-RISK: NEGATIVE
PAP Smear Comment: 0

## 2017-01-21 LAB — LIPID PANEL
CHOLESTEROL TOTAL: 244 mg/dL — AB (ref 100–199)
Chol/HDL Ratio: 7.9 ratio — ABNORMAL HIGH (ref 0.0–4.4)
HDL: 31 mg/dL — ABNORMAL LOW (ref >39)
LDL Calculated: 151 mg/dL — ABNORMAL HIGH (ref 0–99)
Triglycerides: 311 mg/dL — ABNORMAL HIGH (ref 0–149)
VLDL Cholesterol Cal: 62 mg/dL — ABNORMAL HIGH (ref 5–40)

## 2017-03-16 HISTORY — PX: GYN SLING: SHX510438

## 2017-05-25 ENCOUNTER — Other Ambulatory Visit: Payer: Self-pay | Admitting: Nurse Practitioner

## 2017-06-24 ENCOUNTER — Encounter: Payer: Self-pay | Admitting: Family Medicine

## 2017-06-24 ENCOUNTER — Ambulatory Visit (INDEPENDENT_AMBULATORY_CARE_PROVIDER_SITE_OTHER): Payer: 59 | Admitting: Family Medicine

## 2017-06-24 VITALS — BP 127/89 | HR 72 | Temp 98.3°F | Ht 61.0 in | Wt 172.8 lb

## 2017-06-24 DIAGNOSIS — L988 Other specified disorders of the skin and subcutaneous tissue: Secondary | ICD-10-CM

## 2017-06-24 DIAGNOSIS — N649 Disorder of breast, unspecified: Secondary | ICD-10-CM | POA: Diagnosis not present

## 2017-06-24 DIAGNOSIS — Z1239 Encounter for other screening for malignant neoplasm of breast: Secondary | ICD-10-CM

## 2017-06-24 DIAGNOSIS — Z1231 Encounter for screening mammogram for malignant neoplasm of breast: Secondary | ICD-10-CM

## 2017-06-24 NOTE — Progress Notes (Signed)
   HPI  Patient presents today with skin lesion of the right breast.  Patient states the lesion began about 2 months ago as a spot on her skin.  She describes it as a cyst or wart that was dry and scabbing.  She states that it is not as granular as it was and is starting to get larger.  Patient has not felt any lumps or bumps that are concerning to her.  She does not have any family history of breast cancer.  PMH: Smoking status noted ROS: Per HPI  Objective: BP 127/89   Pulse 72   Temp 98.3 F (36.8 C) (Oral)   Ht 5\' 1"  (1.549 m)   Wt 172 lb 12.8 oz (78.4 kg)   BMI 32.65 kg/m  Gen: NAD, alert, cooperative with exam HEENT: NCAT CV: RRR, good S1/S2, no murmur Resp: CTABL, no wheezes, non-labored Ext: No edema, warm Neuro: Alert and oriented, No gross deficits  Breast exam 2 small heme crusted skin lesions at 12:00 located at the rim of the areola  and one approximately 1 cm above the areola Palpable mobile nodule approximately 1 cm diameter just below the lesion Left breast within normal limits  Assessment and plan:  #Skin lesion of the breast Diagnostic mammo-on the right, screening of the left Refer to general surgery for the recommendations about the skin lesions, they have been persistent for 2 months and could warrant treatment. Rule out underlying breast cancer as a priority    Orders Placed This Encounter  Procedures  . MM Digital Diagnostic Unilat R    Standing Status:   Future    Standing Expiration Date:   08/25/2018    Order Specific Question:   Reason for Exam (SYMPTOM  OR DIAGNOSIS REQUIRED)    Answer:   lump at 12:00 approx 1 cm from aerola    Order Specific Question:   Is the patient pregnant?    Answer:   No    Order Specific Question:   [facility]?    [facility]  . MM Digital Screening Unilat L    Standing Status:   Future    Standing Expiration Date:   08/25/2018    Order Specific Question:   Reason for Exam  (SYMPTOM  OR DIAGNOSIS REQUIRED)    Answer:   screening, concern on R    Order Specific Question:   Is the patient pregnant?    Answer:   No    Order Specific Question:   Pref[facility] imaging [facility]?    [facility]  . Ambulatory referral to General Surgery    Referral Priority:   Routine    Referral Type:   Surgical    Referral Reason:   Specialty Services Required    Referred to Provider:   Manus Ruddsuei, Matthew, MD    Requested Specialty:   General Surgery    Number of Visits Requested:   1     Murtis SinkSam Bradshaw, MD [facility] 06/24/2017, 10:12 AM

## 2017-06-30 ENCOUNTER — Other Ambulatory Visit: Payer: Self-pay | Admitting: Family Medicine

## 2017-06-30 DIAGNOSIS — N6310 Unspecified lump in the right breast, unspecified quadrant: Secondary | ICD-10-CM

## 2017-07-02 ENCOUNTER — Ambulatory Visit
Admission: RE | Admit: 2017-07-02 | Discharge: 2017-07-02 | Disposition: A | Discharge status: Home / Self Care | Payer: 59 | Source: Ambulatory Visit | Attending: Family Medicine | Admitting: Family Medicine

## 2017-07-02 DIAGNOSIS — R922 Inconclusive mammogram: Secondary | ICD-10-CM | POA: Diagnosis not present

## 2017-07-02 DIAGNOSIS — N6310 Unspecified lump in the right breast, unspecified quadrant: Secondary | ICD-10-CM

## 2017-07-08 ENCOUNTER — Encounter: Payer: Self-pay | Admitting: Family Medicine

## 2017-07-08 NOTE — Telephone Encounter (Signed)
Hey, what do you think about this? I talked with her, it is getting less red. Spots are new since 01/2017 when I saw her.

## 2017-07-08 NOTE — Telephone Encounter (Signed)
Referring to PCP, would be best to answer this. Can you let pt know? He is out this week, back next week.

## 2017-11-25 DIAGNOSIS — H52223 Regular astigmatism, bilateral: Secondary | ICD-10-CM | POA: Diagnosis not present

## 2017-11-25 DIAGNOSIS — H5213 Myopia, bilateral: Secondary | ICD-10-CM | POA: Diagnosis not present

## 2017-12-13 ENCOUNTER — Encounter: Payer: Self-pay | Admitting: Physician Assistant

## 2017-12-13 ENCOUNTER — Ambulatory Visit: Payer: 59 | Admitting: Physician Assistant

## 2017-12-13 VITALS — BP 117/85 | HR 64 | Temp 97.5°F | Ht 61.0 in | Wt 168.4 lb

## 2017-12-13 DIAGNOSIS — B029 Zoster without complications: Secondary | ICD-10-CM

## 2017-12-13 MED ORDER — PREDNISONE 10 MG (21) PO TBPK: ORAL_TABLET | ORAL | 0 refills | Status: DC

## 2017-12-13 MED ORDER — GABAPENTIN 100 MG PO CAPS: 100.0000 mg | ORAL_CAPSULE | Freq: Three times a day (TID) | ORAL | 3 refills | Status: DC

## 2017-12-13 MED ORDER — VALACYCLOVIR HCL 1 G PO TABS: 1000.0000 mg | ORAL_TABLET | Freq: Two times a day (BID) | ORAL | 0 refills | Status: DC

## 2017-12-13 NOTE — Patient Instructions (Signed)

## 2017-12-13 NOTE — Progress Notes (Signed)
BP 117/85   Pulse 64   Temp (!) 97.5 F (36.4 C) (Oral)   Ht 5\' 1"  (1.549 m)   Wt 168 lb 6.4 oz (76.4 kg)   BMI 31.82 kg/m    Subjective:    Patient ID: Sara Sellers, female    DOB: 01/24/1976, 42 y.o.   MRN: [id number]  HPI: SOPHONIE Sellers is a 42 y.o. female presenting on 12/13/2017 for Rash (left side and radiates down left leg )  This patient comes in today for a rash that is working out over the past 2 days.  It is started that her lower left back and goes down the lateral portion of the left leg.  It is painful at times but the patient states she does have a very good pain tolerance.  She denies any fever or chills.  She has never had shingles before.  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  Note will be given to her and it can be extended if needed.   Past Medical History:  Diagnosis Date  . High triglycerides   . Migraines   . Umbilical hernia 01/2013   Relevant past medical, surgical, family and social history reviewed and updated as indicated. Interim medical history since our last visit reviewed. Allergies and medications reviewed and updated. DATA REVIEWED: CHART IN EPIC  Family History reviewed for pertinent findings.  Review of Systems  Constitutional: Negative.   HENT: Negative.   Eyes: Negative.   Respiratory: Negative.   Gastrointestinal: Negative.   Genitourinary: Negative.   Skin: Positive for rash.    Allergies as of 12/13/2017      Reactions   Amoxicillin Hives   Septra [bactrim] Hives   Gemfibrozil    Extreme achiness      Medication List        Accurate as of 12/13/17  3:44 PM. Always use your most recent med list.          eletriptan 40 MG tablet Commonly known as:  RELPAX Take 1 tablet (40 mg total) by mouth as needed for migraine or headache. One tablet by mouth at onset of headache. May repeat in 2 hours if headache persists or recurs.   gabapentin 100 MG  capsule Commonly known as:  NEURONTIN Take 1 capsule (100 mg total) by mouth 3 (three) times daily.   predniSONE 10 MG (21) Tbpk tablet Commonly known as:  STERAPRED UNI-PAK 21 TAB As directed x 6 days   valACYclovir 1000 MG tablet Commonly known as:  VALTREX Take 1 tablet (1,000 mg total) by mouth 2 (two) times daily.          Objective:    BP 117/85   Pulse 64   Temp (!) 97.5 F (36.4 C) (Oral)   Ht 5\' 1"  (1.549 m)   Wt 168 lb 6.4 oz (76.4 kg)   BMI 31.82 kg/m   Allergies  Allergen Reactions  . Amoxicillin Hives  . Septra [Bactrim] Hives  . Gemfibrozil     Extreme achiness    Wt Readings from Last 3 Encounters:  12/13/17 168 lb 6.4 oz (76.4 kg)  06/24/17 172 lb 12.8 oz (78.4 kg)  01/20/17 169 lb (76.7 kg)    Physical Exam  Constitutional: She is oriented to person, place, and time. She appears well-developed and well-nourished.  HENT:  Head: Normocephalic and atraumatic.  Eyes: Pupils are equal, round, and  reactive to light. Conjunctivae and EOM are normal.  Cardiovascular: Normal rate, regular rhythm, normal heart sounds and intact distal pulses.  Pulmonary/Chest: Effort normal and breath sounds normal.  Abdominal: Soft. Bowel sounds are normal.  Neurological: She is alert and oriented to person, place, and time. She has normal reflexes.  Skin: Skin is warm and dry. Rash noted. Rash is vesicular. There is erythema.     Psychiatric: She has a normal mood and affect. Her behavior is normal. Judgment and thought content normal.    Results for orders placed or performed in visit on 01/20/17  Lipid panel  Result Value Ref Range   Cholesterol, Total 244 (H) 100 - 199 mg/dL   Triglycerides 161 (H) 0 - 149 mg/dL   HDL 31 (L) >09 mg/dL   VLDL Cholesterol Cal 62 (H) 5 - 40 mg/dL   LDL Calculated 604 (H) 0 - 99 mg/dL   Chol/HDL Ratio 7.9 (H) 0.0 - 4.4 ratio  Pap IG and HPV (high risk) DNA detection  Result Value Ref Range   DIAGNOSIS: Comment    Specimen  adequacy: Comment    Clinician Provided ICD10 Comment    Performed by: Comment    PAP Smear Comment .    Note: Comment    Test Methodology Comment    HPV, high-risk Negative Negative      Assessment & Plan:   1. Herpes zoster without complication - valACYclovir (VALTREX) 1000 MG tablet; Take 1 tablet (1,000 mg total) by mouth 2 (two) times daily.  Dispense: 20 tablet; Refill: 0 - predniSONE (STERAPRED UNI-PAK 21 TAB) 10 MG (21) TBPK tablet; As directed x 6 days  Dispense: 21 tablet; Refill: 0 - gabapentin (NEURONTIN) 100 MG capsule; Take 1 capsule (100 mg total) by mouth 3 (three) times daily.  Dispense: 60 capsule; Refill: 3   Continue all other maintenance medications as listed above.  Follow up plan: Return if symptoms worsen or fail to improve.  Educational handout given for shingles  Remus Loffler PA-C [facility] [address]  [place], [place] [address] [phone]   12/13/2017, 3:44 PM

## 2017-12-15 ENCOUNTER — Encounter: Payer: Self-pay | Admitting: Physician Assistant

## 2018-02-24 ENCOUNTER — Other Ambulatory Visit: Payer: Self-pay | Admitting: Nurse Practitioner

## 2018-05-16 ENCOUNTER — Encounter: Payer: Self-pay | Admitting: Family

## 2018-05-16 ENCOUNTER — Ambulatory Visit (INDEPENDENT_AMBULATORY_CARE_PROVIDER_SITE_OTHER): Payer: 59 | Admitting: Family

## 2018-05-16 VITALS — BP 125/87 | HR 66 | Temp 97.1°F | Ht 61.0 in | Wt 166.8 lb

## 2018-05-16 DIAGNOSIS — Z0000 Encounter for general adult medical examination without abnormal findings: Secondary | ICD-10-CM

## 2018-05-16 DIAGNOSIS — Z0001 Encounter for general adult medical examination with abnormal findings: Secondary | ICD-10-CM

## 2018-05-16 DIAGNOSIS — E66811 Obesity, class 1: Secondary | ICD-10-CM | POA: Insufficient documentation

## 2018-05-16 DIAGNOSIS — J209 Acute bronchitis, unspecified: Secondary | ICD-10-CM | POA: Diagnosis not present

## 2018-05-16 DIAGNOSIS — G43009 Migraine without aura, not intractable, without status migrainosus: Secondary | ICD-10-CM

## 2018-05-16 DIAGNOSIS — Z01411 Encounter for gynecological examination (general) (routine) with abnormal findings: Secondary | ICD-10-CM | POA: Diagnosis not present

## 2018-05-16 DIAGNOSIS — E669 Obesity, unspecified: Secondary | ICD-10-CM

## 2018-05-16 DIAGNOSIS — Z01419 Encounter for gynecological examination (general) (routine) without abnormal findings: Secondary | ICD-10-CM

## 2018-05-16 MED ORDER — PREDNISONE 10 MG (21) PO TBPK: ORAL_TABLET | ORAL | 0 refills | Status: DC

## 2018-05-16 MED FILL — predniSONE 10 MG TABS: 10 | 6 days supply | Qty: 21 | Fill #0

## 2018-05-16 NOTE — Progress Notes (Addendum)
Subjective:    Patient ID: Sara Sellers, female    DOB: 03/24/1975, 43 y.o.   MRN: [id number]  Chief Complaint  Patient presents with  . Gynecologic Exam    with pap    Gynecologic Exam  The patient's pertinent negatives include no genital lesions or vaginal discharge. This is a chronic problem. The current episode started more than 1 year ago. The problem occurs intermittently. The problem has been waxing and waning. The patient is experiencing no pain. Pertinent negatives include no chills, flank pain or painful intercourse. The treatment provided no relief. She is sexually active.      Review of Systems  Constitutional: Negative for chills.  Genitourinary: Negative for flank pain and vaginal discharge.  All other systems reviewed and are negative.      Objective:   Physical Exam Vitals signs reviewed.  Constitutional:      General: She is not in acute distress.    Appearance: She is well-developed.  HENT:     Head: Normocephalic and atraumatic.     Right Ear: Tympanic membrane normal.     Left Ear: Tympanic membrane normal.     Mouth/Throat:     Pharynx: Posterior oropharyngeal erythema present.  Eyes:     Pupils: Pupils are equal, round, and reactive to light.  Neck:     Musculoskeletal: Normal range of motion and neck supple.     Thyroid: No thyromegaly.  Cardiovascular:     Rate and Rhythm: Normal rate and regular rhythm.     Heart sounds: Normal heart sounds. No murmur.  Pulmonary:     Effort: Pulmonary effort is normal. No respiratory distress.     Breath sounds: Normal breath sounds. No wheezing.     Comments: Intermittent nonproductive cough Chest:     Breasts:        Right: No swelling, bleeding, inverted nipple, mass, nipple discharge, skin change or tenderness.        Left: No swelling, bleeding, inverted nipple, mass, nipple discharge, skin change or tenderness.  Abdominal:     General: Bowel sounds are normal. There is no distension.   Palpations: Abdomen is soft.     Tenderness: There is no abdominal tenderness.  Genitourinary:    General: Normal vulva.     Comments: Bimanual exam- no adnexal masses or tenderness, ovaries nonpalpable   Cervix parous and pink- No discharge  Musculoskeletal: Normal range of motion.        General: No tenderness.  Skin:    General: Skin is warm and dry.  Neurological:     Mental Status: She is alert and oriented to person, place, and time.     Cranial Nerves: No cranial nerve deficit.     Deep Tendon Reflexes: Reflexes are normal and symmetric.  Psychiatric:        Behavior: Behavior normal.        Thought Content: Thought content normal.        Judgment: Judgment normal.       BP 125/87   Pulse 66   Temp (!) 97.1 F (36.2 C) (Oral)   Ht 5' 1" (1.549 m)   Wt 166 lb 12.8 oz (75.7 kg)   BMI 31.52 kg/m      Assessment & Plan:  [name] comes in today with chief complaint of Gynecologic Exam (with pap)   Diagnosis and orders addressed:  1. Annual physical exam - CMP14+EGFR - CBC with Differential/Platelet - Lipid panel - TSH -  IGP, Aptima HPV, rfx 16/18,45  2. Migraine without aura and without status migrainosus, not intractable - CMP14+EGFR - CBC with Differential/Platelet  3. Encounter for gynecological examination without abnormal finding - CMP14+EGFR - CBC with Differential/Platelet - IGP, Aptima HPV, rfx 16/18,45  4. Obesity (BMI 30.0-34.9) - CMP14+EGFR - CBC with Differential/Platelet   Labs pending Health Maintenance reviewed Diet and exercise encouraged  Follow up plan: 1 year    Christy Hawks, FNP  

## 2018-05-16 NOTE — Addendum Note (Signed)
Addended by: Jannifer Rodney A on: 05/16/2018 01:02 PM   Modules accepted: Orders

## 2018-05-16 NOTE — Patient Instructions (Signed)

## 2018-05-17 LAB — LIPID PANEL
CHOL/HDL RATIO: 9.2 ratio — AB (ref 0.0–4.4)
Cholesterol, Total: 238 mg/dL — ABNORMAL HIGH (ref 100–199)
HDL: 26 mg/dL — ABNORMAL LOW (ref >39)
TRIGLYCERIDES: 638 mg/dL — AB (ref 0–149)

## 2018-05-17 LAB — CMP14+EGFR
A/G RATIO: 2.3 — AB (ref 1.2–2.2)
ALT: 25 IU/L (ref 0–32)
AST: 15 IU/L (ref 0–40)
Albumin: 4.8 g/dL (ref 3.8–4.8)
Alkaline Phosphatase: 69 IU/L (ref 39–117)
BUN/Creatinine Ratio: 13 (ref 9–23)
BUN: 10 mg/dL (ref 6–24)
Bilirubin Total: 0.3 mg/dL (ref 0.0–1.2)
CALCIUM: 9.2 mg/dL (ref 8.7–10.2)
CO2: 20 mmol/L (ref 20–29)
CREATININE: 0.79 mg/dL (ref 0.57–1.00)
Chloride: 104 mmol/L (ref 96–106)
GFR calc Af Amer: 108 mL/min/{1.73_m2} (ref >59)
GFR, EST NON AFRICAN AMERICAN: 93 mL/min/{1.73_m2} (ref >59)
GLUCOSE: 75 mg/dL (ref 65–99)
Globulin, Total: 2.1 g/dL (ref 1.5–4.5)
POTASSIUM: 4.1 mmol/L (ref 3.5–5.2)
Sodium: 141 mmol/L (ref 134–144)
TOTAL PROTEIN: 6.9 g/dL (ref 6.0–8.5)

## 2018-05-17 LAB — CBC WITH DIFFERENTIAL/PLATELET
Basophils Absolute: 0 10*3/uL (ref 0.0–0.2)
Basos: 1 %
EOS (ABSOLUTE): 0.1 10*3/uL (ref 0.0–0.4)
Eos: 1 %
HEMATOCRIT: 39.9 % (ref 34.0–46.6)
HEMOGLOBIN: 14.3 g/dL (ref 11.1–15.9)
Immature Grans (Abs): 0 10*3/uL (ref 0.0–0.1)
Immature Granulocytes: 0 %
LYMPHS: 32 %
Lymphocytes Absolute: 1.9 10*3/uL (ref 0.7–3.1)
MCH: 30.9 pg (ref 26.6–33.0)
MCHC: 35.8 g/dL — AB (ref 31.5–35.7)
MCV: 86 fL (ref 79–97)
MONOCYTES: 9 %
Monocytes Absolute: 0.5 10*3/uL (ref 0.1–0.9)
NEUTROS PCT: 57 %
Neutrophils Absolute: 3.4 10*3/uL (ref 1.4–7.0)
Platelets: 262 10*3/uL (ref 150–450)
RBC: 4.63 x10E6/uL (ref 3.77–5.28)
RDW: 12.4 % (ref 11.7–15.4)
WBC: 5.9 10*3/uL (ref 3.4–10.8)

## 2018-05-17 LAB — TSH: TSH: 1.48 u[IU]/mL (ref 0.450–4.500)

## 2018-05-19 LAB — IGP, APTIMA HPV, RFX 16/18,45: HPV Aptima: NEGATIVE

## 2018-05-20 ENCOUNTER — Other Ambulatory Visit: Payer: Self-pay | Admitting: Family

## 2018-11-18 ENCOUNTER — Telehealth: Payer: 59 | Admitting: Physician Assistant

## 2018-11-18 DIAGNOSIS — R059 Cough, unspecified: Secondary | ICD-10-CM

## 2018-11-18 DIAGNOSIS — R05 Cough: Secondary | ICD-10-CM | POA: Diagnosis not present

## 2018-11-18 MED ORDER — BENZONATATE 100 MG PO CAPS: 100.0000 mg | ORAL_CAPSULE | Freq: Two times a day (BID) | ORAL | 0 refills | Status: DC | PRN

## 2018-11-18 MED ORDER — PREDNISONE 20 MG PO TABS: 40.0000 mg | ORAL_TABLET | Freq: Every day | ORAL | 0 refills | Status: DC

## 2018-11-18 NOTE — Progress Notes (Addendum)
We are sorry that you are not feeling well.  Here is how we plan to help!    Based on your presentation I believe you most likely have A cough due to a virus.  This is called viral bronchitis and is best treated by rest, plenty of fluids and control of the cough.  You may use Ibuprofen or Tylenol as directed to help your symptoms.     In addition you may use A prescription cough medication called Tessalon Perles 100mg . You may take 1-2 capsules every 8 hours as needed for your cough.  If you symptoms are not improved with cough medication and you continue to feel tightness you may use prednisone 40 mg once daily for 5 days.   From your responses in the eVisit questionnaire you describe inflammation in the upper respiratory tract which is causing a significant cough.  This is commonly called Bronchitis and has four common causes:    Allergies  Viral Infections  Acid Reflux  Bacterial Infection Allergies, viruses and acid reflux are treated by controlling symptoms or eliminating the cause. An example might be a cough caused by taking certain blood pressure medications. You stop the cough by changing the medication. Another example might be a cough caused by acid reflux. Controlling the reflux helps control the cough.  USE OF BRONCHODILATOR ("RESCUE") INHALERS: There is a risk from using your bronchodilator too frequently.  The risk is that over-reliance on a medication which only relaxes the muscles surrounding the breathing tubes can reduce the effectiveness of medications prescribed to reduce swelling and congestion of the tubes themselves.  Although you feel brief relief from the bronchodilator inhaler, your asthma may actually be worsening with the tubes becoming more swollen and filled with mucus.  This can delay other crucial treatments, such as oral steroid medications. If you need to use a bronchodilator inhaler daily, several times per day, you should discuss this with your provider.   There are probably better treatments that could be used to keep your asthma under control.     HOME CARE . Only take medications as instructed by your medical team. . Complete the entire course of an antibiotic. . Drink plenty of fluids and get plenty of rest. . Avoid close contacts especially the very young and the elderly . Cover your mouth if you cough or cough into your sleeve. . Always remember to wash your hands . A steam or ultrasonic humidifier can help congestion.   GET HELP RIGHT AWAY IF: . You develop worsening fever. . You become short of breath . You cough up blood. . Your symptoms persist after you have completed your treatment plan MAKE SURE YOU   Understand these instructions.  Will watch your condition.  Will get help right away if you are not doing well or get worse.  Your e-visit answers were reviewed by a board certified advanced clinical practitioner to complete your personal care plan.  Depending on the condition, your plan could have included both over the counter or prescription medications. If there is a problem please reply  once you have received a response from your provider. Your safety is important to us.  If you have drug allergies check your prescription carefully.    You can use MyChart to ask questions about today's visit, request a non-urgent call back, or ask for a work or school excuse for 24 hours related to this e-Visit. If it has been greater than 24 hours you will need to follow  up with your provider, or enter a new e-Visit to address those concerns. You will get an e-mail in the next two days asking about your experience.  I hope that your e-visit has been valuable and will speed your recovery. Thank you for using e-visits.  Greater than 5 minutes, yet less than 10 minutes of time have been spent researching, coordinating, and implementing care for this patient today

## 2018-11-29 ENCOUNTER — Other Ambulatory Visit: Payer: Self-pay | Admitting: Surgery

## 2019-01-27 ENCOUNTER — Encounter: Payer: Self-pay | Admitting: *Deleted

## 2019-01-29 ENCOUNTER — Telehealth: Payer: Self-pay | Admitting: Physician Assistant

## 2019-01-29 DIAGNOSIS — J069 Acute upper respiratory infection, unspecified: Secondary | ICD-10-CM

## 2019-01-29 DIAGNOSIS — H9209 Otalgia, unspecified ear: Secondary | ICD-10-CM

## 2019-01-29 MED ORDER — IPRATROPIUM BROMIDE 0.03 % NA SOLN: 2.0000 | Freq: Two times a day (BID) | NASAL | 0 refills | Status: DC

## 2019-01-29 MED ORDER — AZITHROMYCIN 250 MG PO TABS: ORAL_TABLET | ORAL | 0 refills | Status: DC

## 2019-01-29 NOTE — Progress Notes (Signed)
I have spent 5 minutes in review of e-visit questionnaire, review and updating patient chart, medical decision making and response to patient.   Amier Hoyt Cody Marcey Persad, PA-C    

## 2019-01-29 NOTE — Progress Notes (Signed)
We are sorry you are not feeling well.  Here is how we plan to help!  Based on what you have shared with me, it looks like you may have a viral upper respiratory infection and potentially a secondary bacterial ear infection. Upper respiratory infections are caused by a large number of viruses; however, rhinovirus is the most common cause.   Symptoms vary from person to person, with common symptoms including sore throat, cough, fatigue or lack of energy and feeling of general discomfort.  A low-grade fever of up to 100.4 may present, but is often uncommon.  Symptoms vary however, and are closely related to a person's age or underlying illnesses.  The most common symptoms associated with an upper respiratory infection are nasal discharge or congestion, cough, sneezing, headache and pressure in the ears and face.  These symptoms usually persist for about 3 to 10 days, but can last up to 2 weeks.  It is important to know that upper respiratory infections do not cause serious illness or complications in most cases.    Upper respiratory infections can be transmitted from person to person, with the most common method of transmission being a person's hands.  The virus is able to live on the skin and can infect other persons for up to 2 hours after direct contact.  Also, these can be transmitted when someone coughs or sneezes; thus, it is important to cover the mouth to reduce this risk.  To keep the spread of the illness at [place], good hand hygiene is very important.  This is an infection that is most likely caused by a virus. There are no specific treatments other than to help you with the symptoms until the infection runs its course.  We are sorry you are not feeling well.  Here is how we plan to help!   For nasal congestion, you may use an oral decongestants such as Mucinex D or if you have glaucoma or high blood pressure use plain Mucinex.  Saline nasal spray or nasal drops can help and can safely be used as  often as needed for congestion.  For your congestion, I have prescribed Ipratropium Bromide nasal spray 0.03% two sprays in each nostril 2-3 times a day  If you do not have a history of heart disease, hypertension, diabetes or thyroid disease, prostate/bladder issues or glaucoma, you may also use Sudafed to treat nasal congestion.  It is highly recommended that you consult with a pharmacist or your primary care physician to ensure this medication is safe for you to take.     If you have a cough, you may use cough suppressants such as Delsym and Robitussin.  If you have glaucoma or high blood pressure, you can also use Coricidin HBP.   For likely ear infection I have prescribed: Azithromycin -- Take 2 tablets on Day 2. Then take 1 tablet daily.   If you have a sore or scratchy throat, use a saltwater gargle-  to  teaspoon of salt dissolved in a 4-ounce to 8-ounce glass of warm water.  Gargle the solution for approximately 15-30 seconds and then spit.  It is important not to swallow the solution.  You can also use throat lozenges/cough drops and Chloraseptic spray to help with throat pain or discomfort.  Warm or cold liquids can also be helpful in relieving throat pain.  For headache, pain or general discomfort, you can use Ibuprofen or Tylenol as directed.   Some authorities believe that zinc sprays or the  use of Echinacea may shorten the course of your symptoms.   HOME CARE . Only take medications as instructed by your medical team. . Be sure to drink plenty of fluids. Water is fine as well as fruit juices, sodas and electrolyte beverages. You may want to stay away from caffeine or alcohol. If you are nauseated, try taking small sips of liquids. How do you know if you are getting enough fluid? Your urine should be a pale yellow or almost colorless. . Get rest. . Taking a steamy shower or using a humidifier may help nasal congestion and ease sore throat pain. You can place a towel over your head  and breathe in the steam from hot water coming from a faucet. . Using a saline nasal spray works much the same way. . Cough drops, hard candies and sore throat lozenges may ease your cough. . Avoid close contacts especially the very young and the elderly . Cover your mouth if you cough or sneeze . Always remember to wash your hands.   GET HELP RIGHT AWAY IF: . You develop worsening fever. . If your symptoms do not improve within 10 days . You develop yellow or green discharge from your nose over 3 days. . You have coughing fits . You develop a severe head ache or visual changes. . You develop shortness of breath, difficulty breathing or start having chest pain . Your symptoms persist after you have completed your treatment plan  MAKE SURE YOU   Understand these instructions.  Will watch your condition.  Will get help right away if you are not doing well or get worse.  Your e-visit answers were reviewed by a board certified advanced clinical practitioner to complete your personal care plan. Depending upon the condition, your plan could have included both over the counter or prescription medications. Please review your pharmacy choice. If there is a problem, you may call our nursing hot line at and have the prescription routed to another pharmacy. Your safety is important to [place]. If you have drug allergies check your prescription carefully.   You can use MyChart to ask questions about today's visit, request a non-urgent call back, or ask for a work or school excuse for 24 hours related to this e-Visit. If it has been greater than 24 hours you will need to follow up with your provider, or enter a new e-Visit to address those concerns. You will get an e-mail in the next two days asking about your experience.  I hope that your e-visit has been valuable and will speed your recovery. Thank you for using e-visits.

## 2019-03-02 ENCOUNTER — Other Ambulatory Visit: Payer: Self-pay | Admitting: Physician Assistant

## 2019-03-02 DIAGNOSIS — J069 Acute upper respiratory infection, unspecified: Secondary | ICD-10-CM

## 2019-03-20 ENCOUNTER — Other Ambulatory Visit: Payer: 59

## 2019-04-25 ENCOUNTER — Telehealth: Payer: Self-pay | Admitting: Family Medicine

## 2019-04-25 NOTE — Telephone Encounter (Signed)
Okay with me 

## 2019-04-26 NOTE — Telephone Encounter (Signed)
Patient aware appt made pcp changed in epic

## 2019-05-17 DIAGNOSIS — H5213 Myopia, bilateral: Secondary | ICD-10-CM | POA: Diagnosis not present

## 2019-05-17 DIAGNOSIS — H52223 Regular astigmatism, bilateral: Secondary | ICD-10-CM | POA: Diagnosis not present

## 2019-05-17 DIAGNOSIS — H33102 Unspecified retinoschisis, left eye: Secondary | ICD-10-CM | POA: Diagnosis not present

## 2019-05-18 ENCOUNTER — Encounter: Payer: 59 | Admitting: Family

## 2019-05-19 ENCOUNTER — Encounter: Payer: 59 | Admitting: Family

## 2019-05-25 DIAGNOSIS — H4423 Degenerative myopia, bilateral: Secondary | ICD-10-CM | POA: Diagnosis not present

## 2019-05-25 DIAGNOSIS — H35423 Microcystoid degeneration of retina, bilateral: Secondary | ICD-10-CM | POA: Diagnosis not present

## 2019-05-25 DIAGNOSIS — H33191 Other retinoschisis and retinal cysts, right eye: Secondary | ICD-10-CM | POA: Diagnosis not present

## 2019-05-26 ENCOUNTER — Other Ambulatory Visit: Payer: Self-pay

## 2019-05-26 ENCOUNTER — Ambulatory Visit (INDEPENDENT_AMBULATORY_CARE_PROVIDER_SITE_OTHER): Payer: 59 | Admitting: Physician Assistant

## 2019-05-26 ENCOUNTER — Encounter: Payer: Self-pay | Admitting: Physician Assistant

## 2019-05-26 VITALS — BP 122/81 | HR 85 | Temp 97.5°F | Ht 61.0 in | Wt 184.0 lb

## 2019-05-26 DIAGNOSIS — Z01411 Encounter for gynecological examination (general) (routine) with abnormal findings: Secondary | ICD-10-CM | POA: Diagnosis not present

## 2019-05-26 DIAGNOSIS — G43009 Migraine without aura, not intractable, without status migrainosus: Secondary | ICD-10-CM | POA: Diagnosis not present

## 2019-05-26 DIAGNOSIS — Z01419 Encounter for gynecological examination (general) (routine) without abnormal findings: Secondary | ICD-10-CM | POA: Diagnosis not present

## 2019-05-26 MED ORDER — ELETRIPTAN HYDROBROMIDE 40 MG PO TABS: 40.0000 mg | ORAL_TABLET | ORAL | 5 refills | Status: DC | PRN

## 2019-05-28 NOTE — Patient Instructions (Signed)
Place annual gynecologic exam patient instructions here.

## 2019-05-28 NOTE — Progress Notes (Signed)
BP 122/81   Pulse 85   Temp (!) 97.5 F (36.4 C)   Ht '5\' 1"'  (1.549 m)   Wt 184 lb (83.5 kg)   SpO2 98%   BMI 34.77 kg/m    Subjective:    Patient ID: Mercer Pod, female    DOB: [birth year], 44 y.o.   MRN: [id number]  Migraine  This is a recurrent problem. The problem has been waxing and waning. The pain is located in the bilateral region. Pertinent negatives include no abdominal pain, coughing or fever. She has tried triptans for the symptoms. The treatment provided significant relief.   1. Well female exam with routine gynecological exam  2. Migraine without aura   HPI: SHALISSA EASTERWOOD is a 44 y.o. female presenting on 05/26/2019 for Annual Exam  This patient comes in for annual well physical examination. All medications are reviewed today. There are no reports of any problems with the medications. All of the medical conditions are reviewed and updated.  Lab work is reviewed and will be ordered as medically necessary. There are no new problems reported with today's visit.  Patient reports doing well overall.    Past Medical History:  Diagnosis Date  . High triglycerides   . Migraines   . Umbilical hernia 38/1017   Relevant past medical, surgical, family and social history reviewed and updated as indicated. Interim medical history since our last visit reviewed. Allergies and medications reviewed and updated. DATA REVIEWED: CHART IN EPIC  Family History reviewed for pertinent findings.  Review of Systems  Constitutional: Negative.  Negative for activity change, fatigue and fever.  HENT: Negative.   Eyes: Negative.   Respiratory: Negative.  Negative for cough.   Cardiovascular: Negative.  Negative for chest pain.  Gastrointestinal: Negative.  Negative for abdominal pain.  Endocrine: Negative.   Genitourinary: Negative.  Negative for dysuria.  Musculoskeletal: Negative.   Skin: Negative.   Neurological: Negative.     Allergies as of 05/26/2019      Reactions   Amoxicillin Hives   Septra [bactrim] Hives   Gemfibrozil    Extreme achiness      Medication List       Accurate as of May 26, 2019 11:59 PM. If you have any questions, ask your nurse or doctor.        STOP taking these medications   azithromycin 250 MG tablet Commonly known as: ZITHROMAX Stopped by: Terald Sleeper, PA-C   benzonatate 100 MG capsule Commonly known as: TESSALON Stopped by: Terald Sleeper, PA-C   predniSONE 10 MG (21) Tbpk tablet Commonly known as: STERAPRED UNI-PAK 21 TAB Stopped by: Terald Sleeper, PA-C   predniSONE 20 MG tablet Commonly known as: DELTASONE Stopped by: Terald Sleeper, PA-C     TAKE these medications   eletriptan 40 MG tablet Commonly known as: Relpax Take 1 tablet (40 mg total) by mouth as needed for migraine or headache. One tablet by mouth at onset of headache. May repeat in 2 hours if headache persists or recurs.   ipratropium 0.03 % nasal spray Commonly known as: ATROVENT Place 2 sprays into both nostrils every 12 (twelve) hours.          Objective:    BP 122/81   Pulse 85   Temp (!) 97.5 F (36.4 C)   Ht '5\' 1"'  (1.549 m)   Wt 184 lb (83.5 kg)   SpO2 98%   BMI 34.77 kg/m   Allergies  Allergen Reactions  .  Amoxicillin Hives  . Septra [Bactrim] Hives  . Gemfibrozil     Extreme achiness    Wt Readings from Last 3 Encounters:  05/26/19 184 lb (83.5 kg)  05/16/18 166 lb 12.8 oz (75.7 kg)  12/13/17 168 lb 6.4 oz (76.4 kg)    Physical Exam Constitutional:      Appearance: She is well-developed.  HENT:     Head: Normocephalic and atraumatic.  Eyes:     Conjunctiva/sclera: Conjunctivae normal.     Pupils: Pupils are equal, round, and reactive to light.  Cardiovascular:     Rate and Rhythm: Normal rate and regular rhythm.     Heart sounds: Normal heart sounds.  Pulmonary:     Effort: Pulmonary effort is normal.     Breath sounds: Normal breath sounds.  Chest:     Breasts: Breasts are symmetrical.         Right: No mass, skin change or tenderness.        Left: No mass, skin change or tenderness.  Abdominal:     General: Bowel sounds are normal.     Palpations: Abdomen is soft.  Genitourinary:    Labia:        Right: No tenderness or lesion.        Left: No tenderness or lesion.      Vagina: Normal. No vaginal discharge, tenderness or bleeding.     Cervix: No cervical motion tenderness, discharge or friability.     Uterus: Not deviated, not enlarged and not tender.      Adnexa:        Right: No mass, tenderness or fullness.         Left: No mass, tenderness or fullness.       Rectum: No anal fissure.  Musculoskeletal:     Cervical back: Normal range of motion and neck supple.  Skin:    General: Skin is warm and dry.     Findings: No rash.  Neurological:     Mental Status: She is alert and oriented to person, place, and time.     Deep Tendon Reflexes: Reflexes are normal and symmetric.  Psychiatric:        Behavior: Behavior normal.        Thought Content: Thought content normal.        Judgment: Judgment normal.     Results for orders placed or performed in visit on 05/16/18  CMP14+EGFR  Result Value Ref Range   Glucose 75 65 - 99 mg/dL   BUN 10 6 - 24 mg/dL   Creatinine, Ser 0.79 0.57 - 1.00 mg/dL   GFR calc non Af Amer 93 >59 mL/min/1.73   GFR calc Af Amer 108 >59 mL/min/1.73   BUN/Creatinine Ratio 13 9 - 23   Sodium 141 134 - 144 mmol/L   Potassium 4.1 3.5 - 5.2 mmol/L   Chloride 104 96 - 106 mmol/L   CO2 20 20 - 29 mmol/L   Calcium 9.2 8.7 - 10.2 mg/dL   Total Protein 6.9 6.0 - 8.5 g/dL   Albumin 4.8 3.8 - 4.8 g/dL   Globulin, Total 2.1 1.5 - 4.5 g/dL   Albumin/Globulin Ratio 2.3 (H) 1.2 - 2.2   Bilirubin Total 0.3 0.0 - 1.2 mg/dL   Alkaline Phosphatase 69 39 - 117 IU/L   AST 15 0 - 40 IU/L   ALT 25 0 - 32 IU/L  CBC with Differential/Platelet  Result Value Ref Range   WBC 5.9 3.4 - 10.8  x10E3/uL   RBC 4.63 3.77 - 5.28 x10E6/uL   Hemoglobin 14.3 11.1 - 15.9  g/dL   Hematocrit 39.9 34.0 - 46.6 %   MCV 86 79 - 97 fL   MCH 30.9 26.6 - 33.0 pg   MCHC 35.8 (H) 31.5 - 35.7 g/dL   RDW 12.4 11.7 - 15.4 %   Platelets 262 150 - 450 x10E3/uL   Neutrophils 57 Not Estab. %   Lymphs 32 Not Estab. %   Monocytes 9 Not Estab. %   Eos 1 Not Estab. %   Basos 1 Not Estab. %   Neutrophils Absolute 3.4 1.4 - 7.0 x10E3/uL   Lymphocytes Absolute 1.9 0.7 - 3.1 x10E3/uL   Monocytes Absolute 0.5 0.1 - 0.9 x10E3/uL   EOS (ABSOLUTE) 0.1 0.0 - 0.4 x10E3/uL   Basophils Absolute 0.0 0.0 - 0.2 x10E3/uL   Immature Granulocytes 0 Not Estab. %   Immature Grans (Abs) 0.0 0.0 - 0.1 x10E3/uL  Lipid panel  Result Value Ref Range   Cholesterol, Total 238 (H) 100 - 199 mg/dL   Triglycerides 638 (HH) 0 - 149 mg/dL   HDL 26 (L) >39 mg/dL   VLDL Cholesterol Cal Comment 5 - 40 mg/dL   LDL Calculated Comment 0 - 99 mg/dL   Chol/HDL Ratio 9.2 (H) 0.0 - 4.4 ratio  TSH  Result Value Ref Range   TSH 1.480 0.450 - 4.500 uIU/mL  IGP, Aptima HPV, rfx 16/18,45  Result Value Ref Range   Interpretation NILM    Category NIL    Adequacy SECNI    Clinician Provided ICD10 Comment    Performed by: Comment    Note: Comment    Test Methodology Comment    HPV Aptima Negative Negative      Assessment & Plan:   1. Well female exam with routine gynecological exam - IGP, Aptima HPV, rfx 16/18,45  2. Migraine without aura - eletriptan (RELPAX) 40 MG tablet; Take 1 tablet (40 mg total) by mouth as needed for migraine or headache. One tablet by mouth at onset of headache. May repeat in 2 hours if headache persists or recurs.  Dispense: 10 tablet; Refill: 5   Continue all other maintenance medications as listed above.  Follow up plan: Return in about 1 year (around 05/25/2020) for well exam.  Educational handout given for well exam  Terald Sleeper PA-C [place] [address]  [place], [place] [address] [phone]   05/28/2019, 8:36 PM  Subjective:      AKEMI OVERHOLSER is a 44 y.o. female and is here for a comprehensive physical exam. The patient reports no problems.  Social History   Socioeconomic History  . Marital status: Married    Spouse name: Not on file  . Number of children: 3  . Years of education: AS  . Highest education level: Not on file  Occupational History    Employer: [employer]  Tobacco Use  . Smoking status: Never Smoker  . Smokeless tobacco: Never Used  Substance and Sexual Activity  . Alcohol use: No    Alcohol/week: 0.0 standard drinks  . Drug use: No  . Sexual activity: Yes    Birth control/protection: None  Other Topics Concern  . Not on file  Social History Narrative   Patient is married with 3 children.   Patient is right handed.   Patient has an Associates degree.   Patient drinks 2 cups daily.   Social Determinants of Health  Financial Resource Strain:   . Difficulty of Paying Living Expenses:   Food Insecurity:   . Worried About Charity fundraiser in the Last Year:   . Arboriculturist in the Last Year:   Transportation Needs:   . Film/video editor (Medical):   Marland Kitchen Lack of Transportation (Non-Medical):   Physical Activity:   . Days of Exercise per Week:   . Minutes of Exercise per Session:   Stress:   . Feeling of Stress :   Social Connections:   . Frequency of Communication with Friends and Family:   . Frequency of Social Gatherings with Friends and Family:   . Attends Religious Services:   . Active Member of Clubs or Organizations:   . Attends Archivist Meetings:   Marland Kitchen Marital Status:   Intimate Partner Violence:   . Fear of Current or Ex-Partner:   . Emotionally Abused:   Marland Kitchen Physically Abused:   . Sexually Abused:    Health Maintenance  Topic Date Due  . HIV Screening  Never done  . TETANUS/TDAP  Never done  . PAP SMEAR-Modifier  05/15/2021  . INFLUENZA VACCINE  Discontinued    The following portions of the patient's history were reviewed and updated as  appropriate: allergies, current medications, past family history, past medical history, past social history, past surgical history and problem list.  Review of Systems Pertinent items are noted in HPI.   Objective:    BP 122/81   Pulse 85   Temp (!) 97.5 F (36.4 C)   Ht '5\' 1"'  (1.549 m)   Wt 184 lb (83.5 kg)   SpO2 98%   BMI 34.77 kg/m  General appearance: alert and cooperative Lungs: clear to auscultation bilaterally Breasts: normal appearance, no masses or tenderness Heart: regular rate and rhythm, S1, S2 normal, no murmur, click, rub or gallop Abdomen: soft, non-tender; bowel sounds normal; no masses,  no organomegaly Pelvic: cervix normal in appearance, external genitalia normal, no adnexal masses or tenderness, no cervical motion tenderness, rectovaginal septum normal, uterus normal size, shape, and consistency and vagina normal without discharge Extremities: extremities normal, atraumatic, no cyanosis or edema Skin: Skin color, texture, turgor normal. No rashes or lesions    Assessment:    Healthy female exam.      Plan:     See After Visit Summary for Counseling Recommendations

## 2019-05-29 ENCOUNTER — Telehealth: Payer: Self-pay | Admitting: *Deleted

## 2019-05-29 NOTE — Telephone Encounter (Signed)
Prior Auth for Eletriptan Hydrobromide 40MG  tablets- IN PROCESS   Key: [id number] PA Case ID:  [employer] is reviewing your PA request. You may close this dialog, return to your dashboard, and perform other tasks.  To check for an update later, open this request again from your dashboard. If [employer] has not replied within 24 hours for urgent requests or within 48 hours for standard requests, please contact [employer] at [phone].

## 2019-05-31 NOTE — Telephone Encounter (Signed)
Appeal faxed today/ww

## 2019-06-01 LAB — IGP, APTIMA HPV, RFX 16/18,45: HPV Aptima: NEGATIVE

## 2019-06-01 NOTE — Telephone Encounter (Signed)
Prior Auth for Eletriptan Hydrobromide 40 mg-APPROVED till 05/30/20  Pharmacy notified.

## 2019-06-02 ENCOUNTER — Encounter: Payer: Self-pay | Admitting: *Deleted

## 2019-06-02 MED FILL — ELETRIPTAN HYDROBROMIDE 40: 40 | 30 days supply | Qty: 6 | Fill #0

## 2019-11-09 ENCOUNTER — Other Ambulatory Visit: Payer: 59

## 2019-11-09 ENCOUNTER — Other Ambulatory Visit: Payer: Self-pay

## 2019-11-09 DIAGNOSIS — Z20822 Contact with and (suspected) exposure to covid-19: Secondary | ICD-10-CM

## 2019-11-10 LAB — SARS-COV-2, NAA 2 DAY TAT

## 2019-11-10 LAB — NOVEL CORONAVIRUS, NAA: SARS-CoV-2, NAA: NOT DETECTED

## 2020-04-07 DIAGNOSIS — Z20822 Contact with and (suspected) exposure to covid-19: Secondary | ICD-10-CM | POA: Diagnosis not present

## 2020-04-21 ENCOUNTER — Other Ambulatory Visit (HOSPITAL_COMMUNITY): Payer: Self-pay | Admitting: Family

## 2020-04-21 ENCOUNTER — Telehealth: Payer: 59 | Admitting: Family

## 2020-04-21 DIAGNOSIS — R059 Cough, unspecified: Secondary | ICD-10-CM | POA: Diagnosis not present

## 2020-04-21 MED ORDER — PREDNISONE 10 MG (21) PO TBPK: ORAL_TABLET | ORAL | 0 refills | Status: DC

## 2020-04-21 MED ORDER — BENZONATATE 100 MG PO CAPS: 100.0000 mg | ORAL_CAPSULE | Freq: Three times a day (TID) | ORAL | 0 refills | Status: DC | PRN

## 2020-04-21 NOTE — Progress Notes (Signed)

## 2020-04-22 MED FILL — BENZONATATE 100 MG CAPS: 100 | 7 days supply | Qty: 20 | Fill #0

## 2020-04-22 MED FILL — predniSONE 10 MG TABS: 10 | 6 days supply | Qty: 21 | Fill #0

## 2020-05-24 ENCOUNTER — Other Ambulatory Visit: Payer: Self-pay | Admitting: Nurse Practitioner

## 2020-06-24 ENCOUNTER — Other Ambulatory Visit: Payer: Self-pay

## 2020-07-19 ENCOUNTER — Encounter: Payer: Self-pay | Admitting: Nurse Practitioner

## 2020-07-19 ENCOUNTER — Telehealth: Payer: Self-pay

## 2020-07-19 ENCOUNTER — Other Ambulatory Visit (HOSPITAL_COMMUNITY): Payer: Self-pay

## 2020-07-19 ENCOUNTER — Ambulatory Visit: Payer: 59 | Admitting: Nurse Practitioner

## 2020-07-19 DIAGNOSIS — R0602 Shortness of breath: Secondary | ICD-10-CM

## 2020-07-19 DIAGNOSIS — R059 Cough, unspecified: Secondary | ICD-10-CM | POA: Diagnosis not present

## 2020-07-19 DIAGNOSIS — Z20822 Contact with and (suspected) exposure to covid-19: Secondary | ICD-10-CM | POA: Diagnosis not present

## 2020-07-19 DIAGNOSIS — R0789 Other chest pain: Secondary | ICD-10-CM | POA: Diagnosis not present

## 2020-07-19 MED ORDER — BENZONATATE 100 MG PO CAPS: 100.0000 mg | ORAL_CAPSULE | Freq: Three times a day (TID) | ORAL | 0 refills | Status: DC | PRN

## 2020-07-19 MED ORDER — ALBUTEROL SULFATE HFA 108 (90 BASE) MCG/ACT IN AERS: 2.0000 | INHALATION_SPRAY | Freq: Four times a day (QID) | RESPIRATORY_TRACT | 0 refills | Status: DC | PRN

## 2020-07-19 MED ORDER — ALBUTEROL SULFATE HFA 108 (90 BASE) MCG/ACT IN AERS
2.0000 | INHALATION_SPRAY | Freq: Four times a day (QID) | RESPIRATORY_TRACT | 0 refills | Status: DC | PRN
  Filled 2020-07-19: qty 8.5, 25d supply, fill #0

## 2020-07-19 MED ORDER — BENZONATATE 100 MG PO CAPS
100.0000 mg | ORAL_CAPSULE | Freq: Three times a day (TID) | ORAL | 0 refills | Status: DC | PRN
  Filled 2020-07-19: qty 20, 7d supply, fill #0

## 2020-07-19 NOTE — Progress Notes (Signed)
Virtual Visit  Note Due to COVID-19 pandemic this visit was conducted virtually. This visit type was conducted due to national recommendations for restrictions regarding the COVID-19 Pandemic (e.g. social distancing, sheltering in place) in an effort to limit this patient's exposure and mitigate transmission in our community. All issues noted in this document were discussed and addressed.  A physical exam was not performed with this format.  I connected with Sara Sellers on 07/19/20 at 11:38 by telephone and verified that I am speaking with the correct person using two identifiers. Sara Sellers is currently located at home and her husband is currently with her during visit. The provider, Eldonna-Margaret Daphine Deutscher, FNP is located in their office at time of visit.  I discussed the limitations, risks, security and privacy concerns of performing an evaluation and management service by telephone and the availability of in person appointments. I also discussed with the patient that there may be a patient responsible charge related to this service. The patient expressed understanding and agreed to proceed.   History and Present Illness:   Chief Complaint: Cough   HPI Patient calls in c/o her husband and daughter testing positive for covid on Monday. She has done 3 at home tests and she is negative. She retested today through [place] but results are not back as of yet. She has increasing chest congestion and cough. She has slight chest tightness   Review of Systems  Constitutional: Positive for malaise/fatigue. Negative for chills and fever.  HENT: Positive for congestion. Negative for ear pain, sinus pain and sore throat.   Respiratory: Positive for cough and shortness of breath. Negative for sputum production.   Musculoskeletal: Negative for myalgias.  Neurological: Negative for dizziness and headaches.  All other systems reviewed and are negative.    Observations/Objective: Alert and  oriented- answers all questions appropriately No distress Voice hoarse Cough dry and tight   Assessment and Plan: Sara Sellers in today with chief complaint of Cough   1. Cough 2. SOB (shortness of breath)  3. Chest tightness  4. Close exposure to COVID-19 virus force fluid rest Call if symptoms change or worsen Meds ordered this encounter  Medications  . benzonatate (TESSALON PERLES) 100 MG capsule    Sig: Take 1 capsule (100 mg total) by mouth 3 (three) times daily as needed for cough.    Dispense:  20 capsule    Refill:  0    Order Specific Question:   Supervising Provider    Answer:   Arville Care A [id number]  . albuterol (VENTOLIN HFA) 108 (90 Base) MCG/ACT inhaler    Sig: Inhale 2 puffs into the lungs every 6 (six) hours as needed for wheezing or shortness of breath.    Dispense:  8 g    Refill:  0    Order Specific Question:   Supervising Provider    Answer:   Arville Care A [[id number]]      Follow Up Instructions: prn    I discussed the assessment and treatment plan with the patient. The patient was provided an opportunity to ask questions and all were answered. The patient agreed with the plan and demonstrated an understanding of the instructions.   The patient was advised to call back or seek an in-person evaluation if the symptoms worsen or if the condition fails to improve as anticipated.  The above assessment and management plan was discussed with the patient. The patient verbalized understanding of and has agreed to the  management plan. Patient is aware to call the clinic if symptoms persist or worsen. Patient is aware when to return to the clinic for a follow-up visit. Patient educated on when it is appropriate to go to the emergency department.   Time call ended:  11:50  I provided 12 minutes of  non face-to-face time during this encounter.    Angellica-Margaret Daphine Deutscher, FNP

## 2020-07-19 NOTE — Telephone Encounter (Signed)
Prescription sent to a different pharmacy

## 2020-07-22 ENCOUNTER — Telehealth: Payer: 59 | Admitting: Emergency Medicine

## 2020-07-22 DIAGNOSIS — U071 COVID-19: Secondary | ICD-10-CM

## 2020-07-22 NOTE — Progress Notes (Signed)
E-Visit for Positive Covid Test Result We are sorry you are not feeling well. We are here to help!  Steroids aren't indicated in COVID unless you are hypoxic (oxygen level less than 90-92%)  I recommend staying the course with the treatments that you mentioned in the survey.  You have tested positive for COVID-19, meaning that you were infected with the novel coronavirus and could give the virus to others.  It is vitally important that you stay home so you do not spread it to others.      Please continue isolation at home, for at least 10 days since the start of your symptoms and until you have had 24 hours with no fever (without taking a fever reducer) and with improving of symptoms.  If you have no symptoms but tested positive (or all symptoms resolve after 5 days and you have no fever) you can leave your house but continue to wear a mask around others for an additional 5 days. If you have a fever,continue to stay home until you have had 24 hours of no fever. Most cases improve 5-10 days from onset but we have seen a small number of patients who have gotten worse after the 10 days.  Please be sure to watch for worsening symptoms and remain taking the proper precautions.   Go to the nearest hospital ED for assessment if fever/cough/breathlessness are severe or illness seems like a threat to life.    The following symptoms may appear 2-14 days after exposure: . Fever . Cough . Shortness of breath or difficulty breathing . Chills . Repeated shaking with chills . Muscle pain . Headache . Sore throat . New loss of taste or smell . Fatigue . Congestion or runny nose . Nausea or vomiting . Diarrhea  You have been enrolled in [facility] Monitoring for COVID-19. Daily you will receive a questionnaire within the MyChart website. Our COVID-19 response team will be monitoring your responses daily.    You may also take acetaminophen (Tylenol) as needed for fever.  HOME CARE: . Only take  medications as instructed by your medical team. . Drink plenty of fluids and get plenty of rest. . A steam or ultrasonic humidifier can help if you have congestion.   GET HELP RIGHT AWAY IF YOU HAVE EMERGENCY WARNING SIGNS.  Call 911 or proceed to your closest emergency facility if: . You develop worsening high fever. . Trouble breathing . Bluish lips or face . Persistent pain or pressure in the chest . New confusion . Inability to wake or stay awake . You cough up blood. . Your symptoms become more severe . Inability to hold down food or fluids  This list is not all possible symptoms. Contact your medical provider for any symptoms that are severe or concerning to you.    Your e-visit answers were reviewed by a board certified advanced clinical practitioner to complete your personal care plan.  Depending on the condition, your plan could have included both over the counter or prescription medications.  If there is a problem please reply once you have received a response from your provider.  Your safety is important to [place].  If you have drug allergies check your prescription carefully.    You can use MyChart to ask questions about today's visit, request a non-urgent call back, or ask for a work or school excuse for 24 hours related to this e-Visit. If it has been greater than 24 hours you will need to follow up  with your provider, or enter a new e-Visit to address those concerns. You will get an e-mail in the next two days asking about your experience.  I hope that your e-visit has been valuable and will speed your recovery. Thank you for using e-visits.      Approximately 5 minutes was used in reviewing the patient's chart, questionnaire, prescribing medications, and documentation.

## 2020-08-02 ENCOUNTER — Telehealth: Payer: 59 | Admitting: Emergency Medicine

## 2020-08-02 DIAGNOSIS — J069 Acute upper respiratory infection, unspecified: Secondary | ICD-10-CM

## 2020-08-02 MED ORDER — DOXYCYCLINE HYCLATE 100 MG PO CAPS: 100.0000 mg | ORAL_CAPSULE | Freq: Two times a day (BID) | ORAL | 0 refills | Status: AC

## 2020-08-02 MED ORDER — PREDNISONE 5 MG PO TABS: ORAL_TABLET | ORAL | 0 refills | Status: DC

## 2020-08-02 NOTE — Progress Notes (Signed)
Sara Sellers,  We are sorry that you are not feeling well.  Here is how we plan to help!  Based on what you have shared with me, you may have a secondary bacterial infection.  Because it may be too late this evening to go into the [facility] until Monday, I am sending in a prescription for doxycycline and prednisone. You can still call today to check on availability of the care center for in-person evaluation.  If you do not start to feel better over the weekend, do not hesitate to go to an urgent care or the emergency department.   Please call for an appointment:   Respiratory Clinic at [facility] [address] [place], [place]  [phone]. Monday, Wednesday, Friday 5:30PM to 7:30PM  If you have severe symptoms of any kind, please seek medical care at an emergency room. Our Emergency Departments are best equipped to handle patients with severe symptoms.    [facility] S[facility] Emergency Department [address] [place], [place], [place] [address] [phone]  . [facility] Emergency Department [address] [place], [place] [address] [phone]  . [facility] Emergency Department [address] [place], [place], [place] [address] [phone]  . [facility] Emergency Department [address] [place], [place], [place] [address] [phone]  . [facility] Emergency Department [address] [place], [place], [place] [address] [phone]   If you are having a true medical emergency please call 911.   Based on your presentation I believe you most likely have A cough due to bacteria.  When patients have a fever and a productive cough with a change in color or increased sputum production, we are concerned about bacterial bronchitis.  If left untreated it can progress to pneumonia.  If your symptoms do not improve with your treatment plan it is important that you contact your provider.    I have prescribed Doxycycline 100 mg twice a day for 7 days     In addition you may use A non-prescription cough medication called Mucinex DM: take 2 tablets every 12 hours.  Prednisone 5 mg daily for 6 days (see taper instructions below)  From your responses in the eVisit questionnaire you describe inflammation in the upper respiratory tract which is causing a significant cough.  This is commonly called Bronchitis and has four common causes:    Allergies  Viral Infections  Acid Reflux  Bacterial Infection Allergies, viruses and acid reflux are treated by controlling symptoms or eliminating the cause. An example might be a cough caused by taking certain blood pressure medications. You stop the cough by changing the medication. Another example might be a cough caused by acid reflux. Controlling the reflux helps control the cough.  USE OF BRONCHODILATOR ("RESCUE") INHALERS: There is a risk from using your bronchodilator too frequently.  The risk is that over-reliance on a medication which only relaxes the muscles surrounding the breathing tubes can reduce the effectiveness of medications prescribed to reduce swelling and congestion of the tubes themselves.  Although you feel brief relief from the bronchodilator inhaler, your asthma may actually be worsening with the tubes becoming more swollen and filled with mucus.  This can delay other crucial treatments, such as oral steroid medications. If you need to use a bronchodilator inhaler daily, several times per day, you should discuss this with your provider.  There are probably better treatments that could be used  to keep your asthma under control.     HOME CARE . Only take medications as instructed by your medical team. . Complete the entire course of an antibiotic. . Drink plenty of fluids and get plenty of rest. . Avoid close contacts especially the very young and the elderly . Cover your mouth if you cough or cough into your  sleeve. . Always remember to wash your hands . A steam or ultrasonic humidifier can help congestion.   GET HELP RIGHT AWAY IF: . You develop worsening fever. . You become short of breath . You cough up blood. . Your symptoms persist after you have completed your treatment plan MAKE SURE YOU   Understand these instructions.  Will watch your condition.  Will get help right away if you are not doing well or get worse.  Your e-visit answers were reviewed by a board certified advanced clinical practitioner to complete your personal care plan.  Depending on the condition, your plan could have included both over the counter or prescription medications. If there is a problem please reply  once you have received a response from your provider. Your safety is important to [place].  If you have drug allergies check your prescription carefully.    You can use MyChart to ask questions about today's visit, request a non-urgent call back, or ask for a work or school excuse for 24 hours related to this e-Visit. If it has been greater than 24 hours you will need to follow up with your provider, or enter a new e-Visit to address those concerns. You will get an e-mail in the next two days asking about your experience.  I hope that your e-visit has been valuable and will speed your recovery. Thank you for using e-visits.   Approximately 5 minutes was spent documenting and reviewing patient's chart.

## 2020-08-05 ENCOUNTER — Telehealth: Payer: Self-pay | Admitting: Internal Medicine

## 2020-08-05 ENCOUNTER — Other Ambulatory Visit (HOSPITAL_COMMUNITY): Payer: Self-pay

## 2020-08-05 MED ORDER — FLUTICASONE FUROATE-VILANTEROL 100-25 MCG/INH IN AEPB
1.0000 | INHALATION_SPRAY | Freq: Every day | RESPIRATORY_TRACT | 3 refills | Status: DC
  Filled 2020-08-05: qty 60, 30d supply, fill #0
  Filled 2020-09-01: qty 60, 30d supply, fill #1
  Filled 2020-10-04: qty 60, 30d supply, fill #2
  Filled 2020-11-07: qty 60, 30d supply, fill #3

## 2020-08-05 NOTE — Telephone Encounter (Signed)
Persistent cough, wheezing post COVID. On prednisone/doxy. Exam c/w bronchospasm. Add breo and f/u with covid clinic.  Myrla Halsted MD PCCM

## 2020-08-06 ENCOUNTER — Other Ambulatory Visit (HOSPITAL_COMMUNITY): Payer: Self-pay

## 2020-08-06 ENCOUNTER — Ambulatory Visit
Admission: RE | Admit: 2020-08-06 | Discharge: 2020-08-06 | Disposition: A | Discharge status: Home / Self Care | Payer: 59 | Source: Ambulatory Visit | Attending: Nurse Practitioner | Admitting: Nurse Practitioner

## 2020-08-06 ENCOUNTER — Ambulatory Visit (INDEPENDENT_AMBULATORY_CARE_PROVIDER_SITE_OTHER): Payer: 59 | Admitting: Nurse Practitioner

## 2020-08-06 ENCOUNTER — Other Ambulatory Visit: Payer: Self-pay

## 2020-08-06 VITALS — BP 137/108 | HR 83 | Temp 97.9°F | Resp 18

## 2020-08-06 DIAGNOSIS — R918 Other nonspecific abnormal finding of lung field: Secondary | ICD-10-CM | POA: Diagnosis not present

## 2020-08-06 DIAGNOSIS — R059 Cough, unspecified: Secondary | ICD-10-CM | POA: Diagnosis not present

## 2020-08-06 DIAGNOSIS — R0602 Shortness of breath: Secondary | ICD-10-CM | POA: Diagnosis not present

## 2020-08-06 DIAGNOSIS — Z8616 Personal history of COVID-19: Secondary | ICD-10-CM | POA: Diagnosis not present

## 2020-08-06 MED ORDER — MONTELUKAST SODIUM 10 MG PO TABS
10.0000 mg | ORAL_TABLET | Freq: Every day | ORAL | 3 refills | Status: DC
  Filled 2020-08-06: qty 30, 30d supply, fill #0
  Filled 2020-09-01: qty 30, 30d supply, fill #1
  Filled 2020-10-04: qty 30, 30d supply, fill #2
  Filled 2020-11-07: qty 30, 30d supply, fill #3

## 2020-08-06 NOTE — Progress Notes (Addendum)
@Patient  ID: , female    DOB: [birth year], 45 y.o.   MRN: [id number]  Chief Complaint  Patient presents with  . history of covid    Referring provider: No ref. provider found  HPI  Patient presents today for post-COVID care clinic visit.  Patient tested positive for COVID on May 7.  She is employed through May 9 and has returned to work.  She states that she has been doing deep breathing exercises.  She has been staying well-hydrated.  Her family practice physician has prescribed doxycycline and a prednisone taper which she is currently finishing.  She has also been prescribed Breo inhaler and has albuterol inhaler.  Patient states that she continues to have shortness of breath with exertion, chest tightness, cough.  Her cough is dry.  She denies any recent fever.  She has not had any imaging since diagnosed with COVID.  We discussed that we will get a chest x-ray today. Denies f/c/s, n/v/d, hemoptysis, PND, chest pain or edema.      Allergies  Allergen Reactions  . Amoxicillin Hives  . Septra [Bactrim] Hives  . Gemfibrozil     Extreme achiness     There is no immunization history on file for this patient.  Past Medical History:  Diagnosis Date  . High triglycerides   . Migraines   . Umbilical hernia 01/2013    Tobacco History: Social History   Tobacco Use  Smoking Status Never Smoker  Smokeless Tobacco Never Used   Counseling given: Yes   Outpatient Encounter Medications as of 08/06/2020  Medication Sig  . montelukast (SINGULAIR) 10 MG tablet Take 1 tablet (10 mg total) by mouth at bedtime.  08/08/2020 albuterol (VENTOLIN HFA) 108 (90 Base) MCG/ACT inhaler Inhale 2 puffs into the lungs every 6 (six) hours as needed for wheezing or shortness of breath.  . benzonatate (TESSALON PERLES) 100 MG capsule Take 1 capsule (100 mg total) by mouth 3 (three) times daily as needed for cough.  . doxycycline (VIBRAMYCIN) 100 MG capsule Take 1 capsule (100 mg total) by mouth 2  (two) times daily for 7 days.  Marland Kitchen eletriptan (RELPAX) 40 MG tablet Take 1 tablet (40 mg total) by mouth as needed for migraine or headache. One tablet by mouth at onset of headache. May repeat in 2 hours if headache persists or recurs.  . fluticasone furoate-vilanterol (BREO ELLIPTA) 100-25 MCG/INH AEPB Inhale 1 puff into the lungs daily at 6 (six) AM.  . ipratropium (ATROVENT) 0.03 % nasal spray Place 2 sprays into both nostrils every 12 (twelve) hours.  . predniSONE (DELTASONE) 5 MG tablet Day 1: take 6 tablets  Day 2: take 5 tablets  Day 3: take 4 tablets  Day 4: take 3 tablets  Day 5: take 2 tablets  Day 6:  Take 1 tablet   No facility-administered encounter medications on file as of 08/06/2020.     Review of Systems  Review of Systems  Constitutional: Negative.  Negative for fatigue and fever.  HENT: Negative.   Respiratory: Positive for cough and shortness of breath.   Cardiovascular: Negative.  Negative for chest pain, palpitations and leg swelling.  Gastrointestinal: Negative.   Allergic/Immunologic: Negative.   Neurological: Negative.   Psychiatric/Behavioral: Negative.        Physical Exam  BP (!) 137/108   Pulse 83   Temp 97.9 F (36.6 C)   Resp 18   SpO2 98%   Wt Readings from Last 5 Encounters:  05/26/19 184  lb (83.5 kg)  05/16/18 166 lb 12.8 oz (75.7 kg)  12/13/17 168 lb 6.4 oz (76.4 kg)  06/24/17 172 lb 12.8 oz (78.4 kg)  01/20/17 169 lb (76.7 kg)     Physical Exam Vitals and nursing note reviewed.  Constitutional:      General: She is not in acute distress.    Appearance: She is well-developed.  Cardiovascular:     Rate and Rhythm: Normal rate and regular rhythm.  Pulmonary:     Effort: Pulmonary effort is normal.     Breath sounds: Normal breath sounds.  Musculoskeletal:     Right lower leg: No edema.     Left lower leg: No edema.  Neurological:     Mental Status: She is alert and oriented to person, place, and time.  Psychiatric:        Mood  and Affect: Mood normal.        Behavior: Behavior normal.        Assessment & Plan:   History of COVID-19 Cough:   Stay well hydrated  Stay active  Deep breathing exercises  May take tylenol or fever or pain  May take delsym twice daily   Will order chest x ray:  [facility] [address] [place], [place] [address] [phone] MON - FRI 8:00 AM - 4:00 PM - WALK IN    Continue Breo  Continue albuterol as needed  Will order singulair   Follow up:  Follow up in 2 weeks or sooner if needed      Ivonne Andrew, NP 08/06/2020

## 2020-08-06 NOTE — Assessment & Plan Note (Signed)
Cough:   Stay well hydrated  Stay active  Deep breathing exercises  May take tylenol or fever or pain  May take delsym twice daily   Will order chest x ray:  [facility] [address] [place], [place] [address] [phone] MON - FRI 8:00 AM - 4:00 PM - WALK IN    Continue Breo  Continue albuterol as needed  Will order singulair   Follow up:  Follow up in 2 weeks or sooner if needed

## 2020-08-06 NOTE — Patient Instructions (Addendum)
Covid 19 Cough:   Stay well hydrated  Stay active  Deep breathing exercises  May take tylenol or fever or pain  May take delsym twice daily   Will order chest x ray:  [facility] [address] [place], [place] [address] [phone] MON - FRI 8:00 AM - 4:00 PM - WALK IN    Continue Breo  Continue albuterol as needed  Will order singulair   Follow up:  Follow up in 2 weeks or sooner if needed

## 2020-08-08 ENCOUNTER — Other Ambulatory Visit (HOSPITAL_COMMUNITY): Payer: Self-pay

## 2020-08-08 ENCOUNTER — Other Ambulatory Visit: Payer: Self-pay | Admitting: Nurse Practitioner

## 2020-08-08 MED ORDER — AZITHROMYCIN 250 MG PO TABS
ORAL_TABLET | ORAL | 0 refills | Status: AC
  Filled 2020-08-08: qty 6, 5d supply, fill #0

## 2020-08-19 DIAGNOSIS — H5213 Myopia, bilateral: Secondary | ICD-10-CM | POA: Diagnosis not present

## 2020-08-19 DIAGNOSIS — H52223 Regular astigmatism, bilateral: Secondary | ICD-10-CM | POA: Diagnosis not present

## 2020-08-20 ENCOUNTER — Ambulatory Visit (INDEPENDENT_AMBULATORY_CARE_PROVIDER_SITE_OTHER): Payer: 59 | Admitting: Nurse Practitioner

## 2020-08-20 VITALS — BP 136/95 | HR 80 | Temp 97.9°F | Resp 18

## 2020-08-20 DIAGNOSIS — Z8616 Personal history of COVID-19: Secondary | ICD-10-CM | POA: Diagnosis not present

## 2020-08-20 NOTE — Patient Instructions (Signed)
History of COVID-19 Cough:   Stay well hydrated  Stay active  Deep breathing exercises  May take tylenol or fever or pain  May take delsym twice daily  Continue Breo  Continue albuterol as needed  Continue singulair   Follow up:  Follow up in 4 weeks or sooner if needed - needs repeat imaging

## 2020-08-20 NOTE — Progress Notes (Signed)
@Patient  ID: , female    DOB: 1975-12-26, 45 y.o.   MRN: [id number]  Chief Complaint  Patient presents with  . Follow-up    Referring provider: No ref. provider found  HPI  Patient presents today for post-COVID care clinic visit follow-up.  Patient was last seen in our office on 08/07/2018 today.  Chest x-ray revealed COVID-pneumonia.  Patient was given another round of antibiotics.  She states she is much improved.  She does still have a slight cough.  She is currently on Breo, albuterol, Singulair.  Overall much improved. Denies f/c/s, n/v/d, hemoptysis, PND, chest pain or edema.      Allergies  Allergen Reactions  . Amoxicillin Hives  . Septra [Bactrim] Hives  . Gemfibrozil     Extreme achiness     There is no immunization history on file for this patient.  Past Medical History:  Diagnosis Date  . High triglycerides   . Migraines   . Umbilical hernia 01/2013    Tobacco History: Social History   Tobacco Use  Smoking Status Never Smoker  Smokeless Tobacco Never Used   Counseling given: Yes   Outpatient Encounter Medications as of 08/20/2020  Medication Sig  . albuterol (VENTOLIN HFA) 108 (90 Base) MCG/ACT inhaler Inhale 2 puffs into the lungs every 6 (six) hours as needed for wheezing or shortness of breath.  . benzonatate (TESSALON PERLES) 100 MG capsule Take 1 capsule (100 mg total) by mouth 3 (three) times daily as needed for cough.  . eletriptan (RELPAX) 40 MG tablet Take 1 tablet (40 mg total) by mouth as needed for migraine or headache. One tablet by mouth at onset of headache. May repeat in 2 hours if headache persists or recurs.  . fluticasone furoate-vilanterol (BREO ELLIPTA) 100-25 MCG/INH AEPB Inhale 1 puff into the lungs daily at 6 (six) AM.  . ipratropium (ATROVENT) 0.03 % nasal spray Place 2 sprays into both nostrils every 12 (twelve) hours.  . montelukast (SINGULAIR) 10 MG tablet Take 1 tablet (10 mg total) by mouth at bedtime.  . predniSONE  (DELTASONE) 5 MG tablet Day 1: take 6 tablets  Day 2: take 5 tablets  Day 3: take 4 tablets  Day 4: take 3 tablets  Day 5: take 2 tablets  Day 6:  Take 1 tablet   No facility-administered encounter medications on file as of 08/20/2020.     Review of Systems  Review of Systems  Constitutional: Negative.  Negative for fatigue and fever.  HENT: Negative.   Respiratory: Positive for cough. Negative for shortness of breath.   Cardiovascular: Negative.  Negative for chest pain, palpitations and leg swelling.  Gastrointestinal: Negative.   Allergic/Immunologic: Negative.   Neurological: Negative.   Psychiatric/Behavioral: Negative.        Physical Exam  BP (!) 136/95   Pulse 80   Temp 97.9 F (36.6 C)   Resp 18   SpO2 98%   Wt Readings from Last 5 Encounters:  05/26/19 184 lb (83.5 kg)  05/16/18 166 lb 12.8 oz (75.7 kg)  12/13/17 168 lb 6.4 oz (76.4 kg)  06/24/17 172 lb 12.8 oz (78.4 kg)  01/20/17 169 lb (76.7 kg)     Physical Exam Vitals and nursing note reviewed.  Constitutional:      General: She is not in acute distress.    Appearance: She is well-developed.  Cardiovascular:     Rate and Rhythm: Normal rate and regular rhythm.  Pulmonary:     Effort: Pulmonary  effort is normal.     Breath sounds: Normal breath sounds.  Musculoskeletal:     Right lower leg: No edema.     Left lower leg: No edema.  Neurological:     Mental Status: She is alert and oriented to person, place, and time.  Psychiatric:        Mood and Affect: Mood normal.        Behavior: Behavior normal.       Imaging: DG Chest 2 View  Result Date: 08/07/2020 CLINICAL DATA:  Cough. History of COVID. Chest tightness. Shortness of breath. EXAM: CHEST - 2 VIEW COMPARISON:  None. FINDINGS: The cardiomediastinal contours are normal. Subsegmental left lung base opacity. Right lung is clear. Pulmonary vasculature is normal. No confluent consolidation, pleural effusion, or pneumothorax. No acute osseous  abnormalities are seen. IMPRESSION: Subsegmental left lung base opacity, may represent atelectasis or COVID pneumonia sequela. Electronically Signed   By: Narda Rutherford M.D.   On: 08/07/2020 22:05     Assessment & Plan:   History of COVID-19 Cough:   Stay well hydrated  Stay active  Deep breathing exercises  May take tylenol or fever or pain  May take delsym twice daily  Continue Breo  Continue albuterol as needed  Continue singulair   Follow up:  Follow up in 4 weeks or sooner if needed - needs repeat imaging       Ivonne Andrew, NP 08/21/2020

## 2020-08-21 NOTE — Assessment & Plan Note (Signed)
Cough:   Stay well hydrated  Stay active  Deep breathing exercises  May take tylenol or fever or pain  May take delsym twice daily  Continue Breo  Continue albuterol as needed  Continue singulair   Follow up:  Follow up in 4 weeks or sooner if needed - needs repeat imaging

## 2020-09-02 ENCOUNTER — Other Ambulatory Visit (HOSPITAL_COMMUNITY): Payer: Self-pay

## 2020-09-03 ENCOUNTER — Other Ambulatory Visit (HOSPITAL_COMMUNITY): Payer: Self-pay

## 2020-09-13 ENCOUNTER — Other Ambulatory Visit: Payer: Self-pay

## 2020-09-13 ENCOUNTER — Ambulatory Visit (INDEPENDENT_AMBULATORY_CARE_PROVIDER_SITE_OTHER): Payer: 59 | Admitting: Nurse Practitioner

## 2020-09-13 ENCOUNTER — Ambulatory Visit
Admission: RE | Admit: 2020-09-13 | Discharge: 2020-09-13 | Disposition: A | Discharge status: Home / Self Care | Payer: 59 | Source: Ambulatory Visit | Attending: Nurse Practitioner | Admitting: Nurse Practitioner

## 2020-09-13 VITALS — BP 133/99 | HR 87 | Temp 97.9°F | Resp 18

## 2020-09-13 DIAGNOSIS — R059 Cough, unspecified: Secondary | ICD-10-CM | POA: Diagnosis not present

## 2020-09-13 DIAGNOSIS — R0609 Other forms of dyspnea: Secondary | ICD-10-CM | POA: Diagnosis not present

## 2020-09-13 DIAGNOSIS — Z8616 Personal history of COVID-19: Secondary | ICD-10-CM

## 2020-09-13 DIAGNOSIS — R0600 Dyspnea, unspecified: Secondary | ICD-10-CM | POA: Diagnosis not present

## 2020-09-13 NOTE — Patient Instructions (Addendum)
History of COVID-19 Cough:     Stay well hydrated   Stay active   Deep breathing exercises   May take tylenol or fever or pain   May take delsym twice daily    Continue Breo   Continue albuterol as needed   Continue Singulair   Will order chest x ray:  [facility] [address] [place], [place] [address] [phone] MON - FRI 8:00 AM - 4:00 PM - WALK IN       Follow up:   Follow up in 3 months - recheck need for inhalers / PFT

## 2020-09-13 NOTE — Progress Notes (Signed)
@Patient  ID: , female    DOB: 03/28/1975, 45 y.o.   MRN: [id number]  Chief Complaint  Patient presents with   Follow-up    Referring provider: No ref. provider found  45 year old female with history of migraines, umbilical hernia, obesity.  Tested positive for COVID in May 2022.  Chest Xray 08/07/20: Subsegmental left lung base opacity, may represent atelectasis or COVID pneumonia sequela.  HPI   Patient presents today for post-COVID care clinic visit follow-up.  Patient was last seen in our office on 08/20/2020.  At her last visit she stated that she was much improved.  Patient is currently on Breo, albuterol, Singulair. She states that she is much improved. She does need repeat imaging. Denies f/c/s, n/v/d, hemoptysis, PND, chest pain or edema.       Allergies  Allergen Reactions   Amoxicillin Hives   Septra [Bactrim] Hives   Gemfibrozil     Extreme achiness     There is no immunization history on file for this patient.  Past Medical History:  Diagnosis Date   High triglycerides    Migraines    Umbilical hernia 01/2013    Tobacco History: Social History   Tobacco Use  Smoking Status Never  Smokeless Tobacco Never   Counseling given: Yes   Outpatient Encounter Medications as of 09/13/2020  Medication Sig   albuterol (VENTOLIN HFA) 108 (90 Base) MCG/ACT inhaler Inhale 2 puffs into the lungs every 6 (six) hours as needed for wheezing or shortness of breath.   benzonatate (TESSALON PERLES) 100 MG capsule Take 1 capsule (100 mg total) by mouth 3 (three) times daily as needed for cough.   eletriptan (RELPAX) 40 MG tablet Take 1 tablet (40 mg total) by mouth as needed for migraine or headache. One tablet by mouth at onset of headache. May repeat in 2 hours if headache persists or recurs.   fluticasone furoate-vilanterol (BREO ELLIPTA) 100-25 MCG/INH AEPB Inhale 1 puff into the lungs daily at 6 (six) AM.   ipratropium (ATROVENT) 0.03 % nasal spray Place 2  sprays into both nostrils every 12 (twelve) hours.   montelukast (SINGULAIR) 10 MG tablet Take 1 tablet (10 mg total) by mouth at bedtime.   predniSONE (DELTASONE) 5 MG tablet Day 1: take 6 tablets  Day 2: take 5 tablets  Day 3: take 4 tablets  Day 4: take 3 tablets  Day 5: take 2 tablets  Day 6:  Take 1 tablet   No facility-administered encounter medications on file as of 09/13/2020.     Review of Systems  Review of Systems  Constitutional: Negative.   HENT: Negative.    Respiratory:  Negative for cough and shortness of breath.   Cardiovascular: Negative.   Gastrointestinal: Negative.   Allergic/Immunologic: Negative.   Neurological: Negative.   Psychiatric/Behavioral: Negative.        Physical Exam  BP (!) 133/99   Pulse 87   Temp 97.9 F (36.6 C)   Resp 18   SpO2 97%   Wt Readings from Last 5 Encounters:  05/26/19 184 lb (83.5 kg)  05/16/18 166 lb 12.8 oz (75.7 kg)  12/13/17 168 lb 6.4 oz (76.4 kg)  06/24/17 172 lb 12.8 oz (78.4 kg)  01/20/17 169 lb (76.7 kg)     Physical Exam Vitals and nursing note reviewed.  Constitutional:      General: She is not in acute distress.    Appearance: She is well-developed.  Cardiovascular:     Rate and Rhythm:  Normal rate and regular rhythm.  Pulmonary:     Effort: Pulmonary effort is normal.     Breath sounds: Normal breath sounds.  Neurological:     Mental Status: She is alert and oriented to person, place, and time.      Assessment & Plan:   History of COVID-19 Cough:     Stay well hydrated   Stay active   Deep breathing exercises   May take tylenol or fever or pain   May take delsym twice daily    Continue Breo   Continue albuterol as needed   Continue Singulair   Will order chest x ray:  [facility] [address] [place], [place] [address] [phone] MON - FRI 8:00 AM - 4:00 PM - WALK IN       Follow up:   Follow up in 3 months - recheck need for inhalers / PFT     Ivonne Andrew, NP 09/13/2020

## 2020-09-13 NOTE — Assessment & Plan Note (Signed)
Cough:     Stay well hydrated   Stay active   Deep breathing exercises   May take tylenol or fever or pain   May take delsym twice daily    Continue Breo   Continue albuterol as needed   Continue Singulair   Will order chest x ray:  [facility] [address] [place], [place] [address] [phone] MON - FRI 8:00 AM - 4:00 PM - WALK IN       Follow up:   Follow up in 3 months - recheck need for inhalers / PFT

## 2020-10-04 ENCOUNTER — Other Ambulatory Visit (HOSPITAL_COMMUNITY): Payer: Self-pay

## 2020-10-04 MED ORDER — CARESTART COVID-19 HOME TEST VI KIT
PACK | 0 refills | Status: DC
  Filled 2020-10-04: qty 4, 4d supply, fill #0

## 2020-11-07 ENCOUNTER — Other Ambulatory Visit (HOSPITAL_COMMUNITY): Payer: Self-pay

## 2020-11-08 ENCOUNTER — Other Ambulatory Visit (HOSPITAL_COMMUNITY): Payer: Self-pay

## 2020-12-16 ENCOUNTER — Ambulatory Visit (INDEPENDENT_AMBULATORY_CARE_PROVIDER_SITE_OTHER): Payer: 59 | Admitting: Family Medicine

## 2020-12-16 ENCOUNTER — Encounter: Payer: Self-pay | Admitting: Family Medicine

## 2020-12-16 ENCOUNTER — Ambulatory Visit: Payer: 59

## 2020-12-16 ENCOUNTER — Other Ambulatory Visit (HOSPITAL_COMMUNITY): Payer: Self-pay

## 2020-12-16 DIAGNOSIS — U099 Post covid-19 condition, unspecified: Secondary | ICD-10-CM

## 2020-12-16 DIAGNOSIS — R0602 Shortness of breath: Secondary | ICD-10-CM

## 2020-12-16 MED ORDER — PREDNISONE 20 MG PO TABS
20.0000 mg | ORAL_TABLET | Freq: Every day | ORAL | 0 refills | Status: AC
  Filled 2020-12-16: qty 4, 4d supply, fill #0

## 2020-12-16 NOTE — Progress Notes (Signed)
   Virtual Visit  Note Due to COVID-19 pandemic this visit was conducted virtually. This visit type was conducted due to national recommendations for restrictions regarding the COVID-19 Pandemic (e.g. social distancing, sheltering in place) in an effort to limit this patient's exposure and mitigate transmission in our community. All issues noted in this document were discussed and addressed.  A physical exam was not performed with this format.  I connected with Sara Sellers on 12/16/20 at 1538 by telephone and verified that I am speaking with the correct person using two identifiers. Sara Sellers is currently located at home and no one is currently with her during the visit. The provider, Gabriel Earing, FNP is located in their office at time of visit.  I discussed the limitations, risks, security and privacy concerns of performing an evaluation and management service by telephone and the availability of in person appointments. I also discussed with the patient that there may be a patient responsible charge related to this service. The patient expressed understanding and agreed to proceed.  CC: cough  History and Present Illness:  HPI Sara Sellers reports a dry cough x 3 days. She also reports some congestion and fatigue. She is having increased shortness of breath. These symptoms are relieved by her albuterol inhaler. She has had used her albuterol inhaler every 4 hours for the first two days but has not had to use it at all today so far. She denies fever, chills, body aches, or wheezing. She had Covid in May of this year and has had chronic lung issues since. She currently takes allegra, singular, and breo daily. She takes albuterol prn. She has taken sudafed for her congestion. She has a pulse ox monitor at home but has not checked her oxygen level.     ROS As per HPI.   Observations/Objective: Alert and oriented x 3. Able to speak in full sentences without difficulty.   Assessment and  Plan: [name] was seen today for cough.  Diagnoses and all orders for this visit:  Persistent shortness of breath after COVID-19 ? Asthma with exacerbation. Prednisone burst as below. Continue breo, albuterol prn. Return to office for new or worsening symptoms, or if symptoms persist.  -     predniSONE (DELTASONE) 20 MG tablet; Take 1 tablet (20 mg total) by mouth daily with breakfast for 4 days.    Follow Up Instructions: As needed.     I discussed the assessment and treatment plan with the patient. The patient was provided an opportunity to ask questions and all were answered. The patient agreed with the plan and demonstrated an understanding of the instructions.   The patient was advised to call back or seek an in-person evaluation if the symptoms worsen or if the condition fails to improve as anticipated.  The above assessment and management plan was discussed with the patient. The patient verbalized understanding of and has agreed to the management plan. Patient is aware to call the clinic if symptoms persist or worsen. Patient is aware when to return to the clinic for a follow-up visit. Patient educated on when it is appropriate to go to the emergency department.   Time call ended:  1550  I provided 12 minutes of  non face-to-face time during this encounter.    Gabriel Earing, FNP

## 2020-12-17 ENCOUNTER — Other Ambulatory Visit: Payer: Self-pay | Admitting: Nurse Practitioner

## 2020-12-17 ENCOUNTER — Other Ambulatory Visit: Payer: Self-pay | Admitting: Internal Medicine

## 2020-12-18 ENCOUNTER — Other Ambulatory Visit (HOSPITAL_COMMUNITY): Payer: Self-pay

## 2020-12-18 ENCOUNTER — Other Ambulatory Visit: Payer: Self-pay | Admitting: Family Medicine

## 2020-12-18 MED ORDER — FLUTICASONE FUROATE-VILANTEROL 100-25 MCG/INH IN AEPB
1.0000 | INHALATION_SPRAY | Freq: Every day | RESPIRATORY_TRACT | 3 refills | Status: DC
  Filled 2020-12-18: qty 60, 30d supply, fill #0

## 2020-12-25 ENCOUNTER — Other Ambulatory Visit (HOSPITAL_COMMUNITY): Payer: Self-pay

## 2020-12-25 ENCOUNTER — Other Ambulatory Visit: Payer: Self-pay | Admitting: Nurse Practitioner

## 2020-12-26 ENCOUNTER — Other Ambulatory Visit (HOSPITAL_COMMUNITY): Payer: Self-pay

## 2020-12-27 ENCOUNTER — Other Ambulatory Visit: Payer: Self-pay | Admitting: Nurse Practitioner

## 2020-12-27 ENCOUNTER — Other Ambulatory Visit (HOSPITAL_COMMUNITY): Payer: Self-pay

## 2020-12-31 ENCOUNTER — Other Ambulatory Visit (HOSPITAL_COMMUNITY): Payer: Self-pay

## 2020-12-31 ENCOUNTER — Other Ambulatory Visit: Payer: Self-pay | Admitting: Nurse Practitioner

## 2021-01-02 ENCOUNTER — Other Ambulatory Visit (HOSPITAL_COMMUNITY): Payer: Self-pay

## 2021-01-02 ENCOUNTER — Other Ambulatory Visit: Payer: Self-pay | Admitting: Nurse Practitioner

## 2021-01-03 ENCOUNTER — Other Ambulatory Visit (HOSPITAL_COMMUNITY): Payer: Self-pay

## 2021-01-14 ENCOUNTER — Other Ambulatory Visit: Payer: Self-pay | Admitting: Nurse Practitioner

## 2021-01-14 MED ORDER — OSELTAMIVIR PHOSPHATE 75 MG PO CAPS: 75.0000 mg | ORAL_CAPSULE | Freq: Every day | ORAL | 0 refills | Status: DC

## 2021-01-14 NOTE — Progress Notes (Signed)
Prophylaxis treatments family member tested positive for influenza A.

## 2021-01-15 ENCOUNTER — Other Ambulatory Visit: Payer: Self-pay

## 2021-01-15 ENCOUNTER — Other Ambulatory Visit (HOSPITAL_COMMUNITY): Payer: Self-pay

## 2021-01-15 ENCOUNTER — Encounter: Payer: Self-pay | Admitting: Family Medicine

## 2021-01-15 ENCOUNTER — Ambulatory Visit (INDEPENDENT_AMBULATORY_CARE_PROVIDER_SITE_OTHER): Payer: 59 | Admitting: Family Medicine

## 2021-01-15 ENCOUNTER — Telehealth: Payer: Self-pay | Admitting: Family Medicine

## 2021-01-15 VITALS — BP 129/82 | HR 76 | Temp 98.6°F | Ht 61.0 in | Wt 188.4 lb

## 2021-01-15 DIAGNOSIS — Z1231 Encounter for screening mammogram for malignant neoplasm of breast: Secondary | ICD-10-CM

## 2021-01-15 DIAGNOSIS — U099 Post covid-19 condition, unspecified: Secondary | ICD-10-CM | POA: Diagnosis not present

## 2021-01-15 DIAGNOSIS — Z0001 Encounter for general adult medical examination with abnormal findings: Secondary | ICD-10-CM

## 2021-01-15 DIAGNOSIS — Z13220 Encounter for screening for lipoid disorders: Secondary | ICD-10-CM | POA: Diagnosis not present

## 2021-01-15 DIAGNOSIS — Z23 Encounter for immunization: Secondary | ICD-10-CM

## 2021-01-15 DIAGNOSIS — J069 Acute upper respiratory infection, unspecified: Secondary | ICD-10-CM | POA: Diagnosis not present

## 2021-01-15 DIAGNOSIS — R0602 Shortness of breath: Secondary | ICD-10-CM | POA: Diagnosis not present

## 2021-01-15 DIAGNOSIS — Z0000 Encounter for general adult medical examination without abnormal findings: Secondary | ICD-10-CM

## 2021-01-15 DIAGNOSIS — G43009 Migraine without aura, not intractable, without status migrainosus: Secondary | ICD-10-CM

## 2021-01-15 MED ORDER — IPRATROPIUM BROMIDE 0.03 % NA SOLN
2.0000 | Freq: Two times a day (BID) | NASAL | 11 refills | Status: DC
  Filled 2021-01-15: qty 30, 43d supply, fill #0
  Filled 2021-05-12: qty 30, 43d supply, fill #1

## 2021-01-15 MED ORDER — ELETRIPTAN HYDROBROMIDE 40 MG PO TABS
40.0000 mg | ORAL_TABLET | ORAL | 5 refills | Status: DC | PRN
  Filled 2021-01-15: qty 6, 30d supply, fill #0
  Filled 2021-06-05 – 2021-06-12 (×4): qty 10, 30d supply, fill #0

## 2021-01-15 MED ORDER — MONTELUKAST SODIUM 10 MG PO TABS
10.0000 mg | ORAL_TABLET | Freq: Every day | ORAL | 3 refills | Status: DC
  Filled 2021-01-15: qty 90, 90d supply, fill #0
  Filled 2021-05-12: qty 90, 90d supply, fill #1

## 2021-01-15 MED ORDER — ALBUTEROL SULFATE HFA 108 (90 BASE) MCG/ACT IN AERS
2.0000 | INHALATION_SPRAY | Freq: Four times a day (QID) | RESPIRATORY_TRACT | 5 refills | Status: DC | PRN
  Filled 2021-01-15: qty 8.5, 25d supply, fill #0

## 2021-01-15 NOTE — Telephone Encounter (Signed)
REFERRAL REQUEST Telephone Note  Have you been seen at our office for this problem? yes (Advise that they may need an appointment with their PCP before a referral can be done)  Reason for Referral: mammogram/ no problems Referral discussed with patient: yes  Best contact number of patient for referral team: [phone]     Has patient been seen by a specialist for this issue before: yes  Patient provider preference for referral: place across the street from [facility]. Patient location preference for referral: place across the street from [facility]   Patient notified that referrals can take up to a week or longer to process. If they haven't heard anything within a week they should call back and speak with the referral department.

## 2021-01-15 NOTE — Progress Notes (Signed)
Subjective:  Patient ID: Sara Sellers, female    DOB: [birth year]  Age: 45 y.o. MRN: [id number]  CC: Annual Exam   HPI Sara Sellers presents for CPE.   Had Covid 6 mos ago and it caused DOE. Still taking the Breo. Pulmonary suggested she use it 6-9 mos. 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 Out of singulair. Has noticed more head and chest congestion.   History Sara Sellers has a past medical history of High triglycerides, Migraines, and Umbilical hernia (40/1027).  hea She has a past surgical history that includes Dilation and curettage of uterus (01/09/2004); Umbilical hernia repair (N/A, 01/23/2013); Hernia repair (01/23/2013); and Hysteroscopy with novasure.   Her family history includes Cancer in her maternal grandfather and paternal grandmother; Diabetes in her maternal grandfather, maternal grandmother, and mother; Early death in her maternal aunt and maternal uncle; Heart disease in her father, maternal aunt, maternal grandfather, maternal uncle, paternal aunt, paternal grandfather, and paternal uncle; Hyperlipidemia in her brother and brother; Hypertension in her father, maternal grandfather, and mother; Hypothyroidism in her maternal grandmother; Stroke in her paternal grandmother; Uterine cancer in her mother.She reports that she has never smoked. She has never used smokeless tobacco. She reports that she does not drink alcohol and does not use drugs.    ROS Review of Systems  Constitutional: Negative.  Negative for appetite change, chills, diaphoresis, fatigue, fever and unexpected weight change.  HENT:  Negative for congestion, ear pain, hearing loss, postnasal drip, rhinorrhea, sneezing, sore throat and trouble swallowing.   Eyes:  Negative for pain and visual disturbance.  Respiratory:  Positive for shortness of breath (since Covid. Better swith Breo). Negative for cough and chest tightness.   Cardiovascular:  Negative for chest pain and palpitations.  Gastrointestinal:  Negative for abdominal pain,  constipation, diarrhea, nausea and vomiting.  Endocrine: Negative for cold intolerance, heat intolerance, polydipsia, polyphagia and polyuria.  Genitourinary:  Negative for difficulty urinating, dysuria, frequency and menstrual problem.  Musculoskeletal:  Negative for arthralgias, joint swelling and myalgias.  Skin:  Negative for rash.  Allergic/Immunologic: Negative for environmental allergies.  Neurological:  Negative for dizziness, weakness, numbness and headaches.  Psychiatric/Behavioral:  Negative for agitation, dysphoric mood and sleep disturbance.    Objective:  BP 129/82   Pulse 76   Temp 98.6 F (37 C)   Ht '5\' 1"'  (1.549 m)   Wt 188 lb 6.4 oz (85.5 kg)   SpO2 99%   BMI 35.60 kg/m   BP Readings from Last 3 Encounters:  01/15/21 129/82  09/13/20 (!) 133/99  08/21/20 (!) 136/95    Wt Readings from Last 3 Encounters:  01/15/21 188 lb 6.4 oz (85.5 kg)  05/26/19 184 lb (83.5 kg)  05/16/18 166 lb 12.8 oz (75.7 kg)     Physical Exam Constitutional:      General: She is not in acute distress.    Appearance: Normal appearance. She is well-developed.  HENT:     Head: Normocephalic and atraumatic.     Right Ear: External ear normal.     Left Ear: External ear normal.     Nose: Nose normal.  Eyes:     Conjunctiva/sclera: Conjunctivae normal.     Pupils: Pupils are equal, round, and reactive to light.  Neck:     Thyroid: No thyromegaly.  Cardiovascular:     Rate and Rhythm: Normal rate and regular rhythm.     Heart sounds: Normal heart sounds. No murmur heard. Pulmonary:     Effort: Pulmonary effort is  normal. No respiratory distress.     Breath sounds: Normal breath sounds. No wheezing or rales.  Chest:  Breasts:    Breasts are symmetrical.     Right: No inverted nipple, mass or tenderness.     Left: No inverted nipple, mass or tenderness.  Abdominal:     General: Bowel sounds are normal. There is no distension or abdominal bruit.     Palpations: Abdomen is  soft. There is no hepatomegaly, splenomegaly or mass.     Tenderness: There is no abdominal tenderness. Negative signs include Murphy's sign and McBurney's sign.  Musculoskeletal:        General: No tenderness. Normal range of motion.     Cervical back: Normal range of motion and neck supple.  Lymphadenopathy:     Cervical: No cervical adenopathy.  Skin:    General: Skin is warm and dry.     Findings: No rash.  Neurological:     Mental Status: She is alert and oriented to person, place, and time.     Deep Tendon Reflexes: Reflexes are normal and symmetric.  Psychiatric:        Behavior: Behavior normal.        Thought Content: Thought content normal.        Judgment: Judgment normal.      Assessment & Plan:   Sara Sellers was seen today for annual exam.  Diagnoses and all orders for this visit:  Annual physical exam -     CBC with Differential/Platelet; Future -     CMP14+EGFR; Future -     Lipid panel; Future -     Urinalysis; Future  Need for Tdap vaccination -     Tdap vaccine greater than or equal to 7yo IM  Persistent shortness of breath after COVID-19 -     montelukast (SINGULAIR) 10 MG tablet; Take 1 tablet (10 mg total) by mouth at bedtime. -     ipratropium (ATROVENT) 0.03 % nasal spray; Place 2 sprays into both nostrils every 12 (twelve) hours. -     albuterol (VENTOLIN HFA) 108 (90 Base) MCG/ACT inhaler; Inhale 2 puffs into the lungs every 6 (six) hours as needed for wheezing or shortness of breath.  Screening for cholesterol level -     Lipid panel; Future  Migraine without aura and without status migrainosus, not intractable -     eletriptan (RELPAX) 40 MG tablet; Take 1 tablet (40 mg total) by mouth as needed for migraine or headache. May repeat in 2 hours if headache persists or recurs.  Viral URI with cough      I have discontinued Sara Sellers. Sara Sellers's benzonatate and Carestart COVID-19 Home Test. I have also changed her eletriptan. Additionally, I am having  her maintain her fluticasone furoate-vilanterol, oseltamivir, montelukast, ipratropium, and albuterol.  Allergies as of 01/15/2021       Reactions   Amoxicillin Hives   Septra [bactrim] Hives   Gemfibrozil    Extreme achiness        Medication List        Accurate as of January 15, 2021  8:39 PM. If you have any questions, ask your nurse or doctor.          STOP taking these medications    benzonatate 100 MG capsule Commonly known as: Best boy Stopped by: Claretta Fraise, MD   Carestart COVID-19 Home Test Kit Generic drug: COVID-19 At Home Antigen Test Stopped by: Claretta Fraise, MD       TAKE  these medications    albuterol 108 (90 Base) MCG/ACT inhaler Commonly known as: VENTOLIN HFA Inhale 2 puffs into the lungs every 6 (six) hours as needed for wheezing or shortness of breath.   Breo Ellipta 100-25 MCG/ACT Aepb Generic drug: fluticasone furoate-vilanterol Inhale 1 puff into the lungs daily at 6 (six) AM.   eletriptan 40 MG tablet Commonly known as: Relpax Take 1 tablet (40 mg total) by mouth as needed for migraine or headache. May repeat in 2 hours if headache persists or recurs. What changed: additional instructions Changed by: Claretta Fraise, MD   ipratropium 0.03 % nasal spray Commonly known as: ATROVENT Place 2 sprays into both nostrils every 12 (twelve) hours.   montelukast 10 MG tablet Commonly known as: SINGULAIR Take 1 tablet (10 mg total) by mouth at bedtime.   oseltamivir 75 MG capsule Commonly known as: Tamiflu Take 1 capsule (75 mg total) by mouth daily.         Follow-up: Return in about 1 year (around 01/15/2022).  Claretta Fraise, M.D.

## 2021-01-17 ENCOUNTER — Other Ambulatory Visit: Payer: Self-pay

## 2021-01-17 DIAGNOSIS — Z13220 Encounter for screening for lipoid disorders: Secondary | ICD-10-CM

## 2021-01-17 DIAGNOSIS — Z0000 Encounter for general adult medical examination without abnormal findings: Secondary | ICD-10-CM

## 2021-01-17 LAB — CBC WITH DIFFERENTIAL/PLATELET
Basophils Absolute: 0 10*3/uL (ref 0.0–0.2)
Basos: 1 %
EOS (ABSOLUTE): 0 10*3/uL (ref 0.0–0.4)
Eos: 1 %
Hematocrit: 36.7 % (ref 34.0–46.6)
Hemoglobin: 12.7 g/dL (ref 11.1–15.9)
Immature Grans (Abs): 0 10*3/uL (ref 0.0–0.1)
Immature Granulocytes: 1 %
Lymphocytes Absolute: 1 10*3/uL (ref 0.7–3.1)
Lymphs: 22 %
MCH: 30.1 pg (ref 26.6–33.0)
MCHC: 34.6 g/dL (ref 31.5–35.7)
MCV: 87 fL (ref 79–97)
Monocytes Absolute: 0.6 10*3/uL (ref 0.1–0.9)
Monocytes: 13 %
Neutrophils Absolute: 2.8 10*3/uL (ref 1.4–7.0)
Neutrophils: 62 %
Platelets: 211 10*3/uL (ref 150–450)
RBC: 4.22 x10E6/uL (ref 3.77–5.28)
RDW: 11.8 % (ref 11.7–15.4)
WBC: 4.4 10*3/uL (ref 3.4–10.8)

## 2021-01-17 LAB — CMP14+EGFR
ALT: 23 IU/L (ref 0–32)
AST: 16 IU/L (ref 0–40)
Albumin/Globulin Ratio: 1.9 (ref 1.2–2.2)
Albumin: 4.6 g/dL (ref 3.8–4.8)
Alkaline Phosphatase: 87 IU/L (ref 44–121)
BUN/Creatinine Ratio: 13 (ref 9–23)
BUN: 13 mg/dL (ref 6–24)
Bilirubin Total: 0.4 mg/dL (ref 0.0–1.2)
CO2: 23 mmol/L (ref 20–29)
Calcium: 9 mg/dL (ref 8.7–10.2)
Chloride: 103 mmol/L (ref 96–106)
Creatinine, Ser: 0.99 mg/dL (ref 0.57–1.00)
Globulin, Total: 2.4 g/dL (ref 1.5–4.5)
Glucose: 87 mg/dL (ref 70–99)
Potassium: 4.4 mmol/L (ref 3.5–5.2)
Sodium: 139 mmol/L (ref 134–144)
Total Protein: 7 g/dL (ref 6.0–8.5)
eGFR: 72 mL/min/{1.73_m2} (ref >59)

## 2021-01-17 NOTE — Telephone Encounter (Signed)
Order placed and sent to [facility] - patient aware

## 2021-01-18 LAB — LIPID PANEL
Chol/HDL Ratio: 8.4 ratio — ABNORMAL HIGH (ref 0.0–4.4)
Cholesterol, Total: 226 mg/dL — ABNORMAL HIGH (ref 100–199)
HDL: 27 mg/dL — ABNORMAL LOW (ref >39)
LDL Chol Calc (NIH): 95 mg/dL (ref 0–99)
Triglycerides: 623 mg/dL (ref 0–149)
VLDL Cholesterol Cal: 104 mg/dL — ABNORMAL HIGH (ref 5–40)

## 2021-01-18 LAB — URINALYSIS
Bilirubin, UA: NEGATIVE
Glucose, UA: NEGATIVE
Ketones, UA: NEGATIVE
Leukocytes,UA: NEGATIVE
Nitrite, UA: NEGATIVE
Protein,UA: NEGATIVE
RBC, UA: NEGATIVE
Specific Gravity, UA: 1.018 (ref 1.005–1.030)
Urobilinogen, Ur: 0.2 mg/dL (ref 0.2–1.0)
pH, UA: 5.5 (ref 5.0–7.5)

## 2021-01-21 ENCOUNTER — Other Ambulatory Visit: Payer: Self-pay | Admitting: Family Medicine

## 2021-01-21 ENCOUNTER — Other Ambulatory Visit (HOSPITAL_COMMUNITY): Payer: Self-pay

## 2021-01-21 ENCOUNTER — Encounter: Payer: Self-pay | Admitting: Family Medicine

## 2021-01-21 MED ORDER — ROSUVASTATIN CALCIUM 10 MG PO TABS
10.0000 mg | ORAL_TABLET | Freq: Every day | ORAL | 1 refills | Status: DC
Start: 2021-01-21 — End: 2021-10-22
  Filled 2021-01-21: qty 90, 90d supply, fill #0
  Filled 2021-05-12: qty 90, 90d supply, fill #1

## 2021-02-03 ENCOUNTER — Other Ambulatory Visit (HOSPITAL_COMMUNITY): Payer: Self-pay

## 2021-02-03 MED ORDER — FLUTICASONE FUROATE-VILANTEROL 100-25 MCG/ACT IN AEPB
1.0000 | INHALATION_SPRAY | Freq: Every day | RESPIRATORY_TRACT | 2 refills | Status: DC
  Filled 2021-02-03: qty 60, 30d supply, fill #0
  Filled 2021-03-27: qty 60, 30d supply, fill #1
  Filled 2021-05-12: qty 60, 30d supply, fill #2

## 2021-03-04 ENCOUNTER — Ambulatory Visit
Admission: RE | Admit: 2021-03-04 | Discharge: 2021-03-04 | Disposition: A | Discharge status: Home / Self Care | Payer: 59 | Source: Ambulatory Visit | Attending: Family Medicine | Admitting: Family Medicine

## 2021-03-04 DIAGNOSIS — Z1231 Encounter for screening mammogram for malignant neoplasm of breast: Secondary | ICD-10-CM

## 2021-03-27 ENCOUNTER — Other Ambulatory Visit (HOSPITAL_COMMUNITY): Payer: Self-pay

## 2021-04-21 ENCOUNTER — Encounter: Payer: Self-pay | Admitting: Nurse Practitioner

## 2021-04-21 ENCOUNTER — Ambulatory Visit: Payer: 59 | Admitting: Nurse Practitioner

## 2021-04-21 DIAGNOSIS — J069 Acute upper respiratory infection, unspecified: Secondary | ICD-10-CM | POA: Insufficient documentation

## 2021-04-21 MED ORDER — AZITHROMYCIN 250 MG PO TABS: ORAL_TABLET | ORAL | 0 refills | Status: AC

## 2021-04-21 MED ORDER — PSEUDOEPH-BROMPHEN-DM 30-2-10 MG/5ML PO SYRP: 5.0000 mL | ORAL_SOLUTION | Freq: Four times a day (QID) | ORAL | 0 refills | Status: DC | PRN

## 2021-04-21 NOTE — Patient Instructions (Signed)

## 2021-04-21 NOTE — Progress Notes (Signed)
Acute Office Visit  Subjective:    Patient ID: Sara Sellers, female    DOB: Jul 13, 1975, 46 y.o.   MRN: [id number]  Chief Complaint  Patient presents with   Cough    Congestion Ears plugged Wheezing Shortness of breath w/ exertion Yellow sputum    URI  This is a new problem. The current episode started in the past 7 days. The problem has been gradually worsening. There has been no fever. Associated symptoms include coughing, headaches, sinus pain and a sore throat. Pertinent negatives include no abdominal pain or rash. She has tried nothing for the symptoms.  Cough This is a new problem. The current episode started in the past 7 days. The problem has been gradually worsening. The problem occurs constantly. The cough is Productive of sputum. Associated symptoms include chills, ear congestion, headaches, nasal congestion and a sore throat. Pertinent negatives include no fever or rash. Nothing aggravates the symptoms.   Past Medical History:  Diagnosis Date   High triglycerides    Migraines    Umbilical hernia 16/6063    Past Surgical History:  Procedure Laterality Date   DILATION AND CURETTAGE OF UTERUS  01/09/2004   non-viable fetus   HERNIA REPAIR  01/60/1093   Umbilical Hernia   HYSTEROSCOPY WITH NOVASURE     UMBILICAL HERNIA REPAIR N/A 01/23/2013   Procedure: HERNIA REPAIR UMBILICAL ADULT;  Surgeon: Rolm Bookbinder, MD;  Location: [facility];  Service: General;  Laterality: N/A;    Family History  Problem Relation Age of Onset   Diabetes Mother    Uterine cancer Mother    Hypertension Mother    Hypertension Father    Heart disease Father    Diabetes Maternal Grandmother    Hypothyroidism Maternal Grandmother    Diabetes Maternal Grandfather    Hypertension Maternal Grandfather    Heart disease Maternal Grandfather    Cancer Maternal Grandfather        lung w/ mets to bone   Hyperlipidemia Brother    Stroke Paternal Grandmother    Cancer  Paternal Grandmother    Heart disease Paternal Grandfather    Hyperlipidemia Brother    Heart disease Maternal Aunt    Early death Maternal Aunt    Heart disease Maternal Uncle    Early death Maternal Uncle    Heart disease Paternal Aunt    Heart disease Paternal Uncle    Breast cancer Neg Hx     Social History   Socioeconomic History   Marital status: Married    Spouse name: Not on file   Number of children: 3   Years of education: AS   Highest education level: Not on file  Occupational History    Employer: [employer]  Tobacco Use   Smoking status: Never   Smokeless tobacco: Never  Vaping Use   Vaping Use: Never used  Substance and Sexual Activity   Alcohol use: No    Alcohol/week: 0.0 standard drinks   Drug use: No   Sexual activity: Yes    Birth control/protection: None  Other Topics Concern   Not on file  Social History Narrative   Patient is married with 3 children.   Patient is right handed.   Patient has an Associates degree.   Patient drinks 2 cups daily.   Social Determinants of Health   Financial Resource Strain: Not on file  Food Insecurity: Not on file  Transportation Needs: Not on file  Physical Activity: Not on file  Stress: Not on file  Social Connections: Not on file  Intimate Partner Violence: Not on file    Outpatient Medications Prior to Visit  Medication Sig Dispense Refill   albuterol (VENTOLIN HFA) 108 (90 Base) MCG/ACT inhaler Inhale 2 puffs into the lungs every 6 (six) hours as needed for wheezing or shortness of breath. 8.5 g 5   eletriptan (RELPAX) 40 MG tablet Take 1 tablet (40 mg total) by mouth as needed for migraine or headache. May repeat in 2 hours if headache persists or recurs. 10 tablet 5   fluticasone furoate-vilanterol (BREO ELLIPTA) 100-25 MCG/ACT AEPB Inhale 1 puff into the lungs daily at 6 (six) AM. 60 each 2   ipratropium (ATROVENT) 0.03 % nasal spray Place 2 sprays into both nostrils every 12 (twelve) hours. 30 mL 11    montelukast (SINGULAIR) 10 MG tablet Take 1 tablet (10 mg total) by mouth at bedtime. 90 tablet 3   rosuvastatin (CRESTOR) 10 MG tablet Take 1 tablet (10 mg total) by mouth daily. For cholesterol 90 tablet 1   oseltamivir (TAMIFLU) 75 MG capsule Take 1 capsule (75 mg total) by mouth daily. 10 capsule 0   No facility-administered medications prior to visit.    Allergies  Allergen Reactions   Amoxicillin Hives   Septra [Bactrim] Hives   Gemfibrozil     Extreme achiness    Review of Systems  Constitutional:  Positive for chills. Negative for fever.  HENT:  Positive for sinus pain and sore throat.   Respiratory:  Positive for cough.   Gastrointestinal:  Negative for abdominal pain.  Skin:  Negative for rash.  Neurological:  Positive for headaches.  All other systems reviewed and are negative.     Objective:    Physical Exam Vitals and nursing note reviewed.  Constitutional:      Appearance: Normal appearance.  HENT:     Head: Normocephalic.     Right Ear: External ear normal.     Left Ear: External ear normal.     Nose: Congestion present.     Mouth/Throat:     Mouth: Mucous membranes are moist.     Pharynx: Oropharynx is clear.  Eyes:     Conjunctiva/sclera: Conjunctivae normal.  Cardiovascular:     Rate and Rhythm: Normal rate and regular rhythm.     Pulses: Normal pulses.     Heart sounds: Normal heart sounds.  Pulmonary:     Effort: Pulmonary effort is normal.     Breath sounds: Normal breath sounds.  Abdominal:     General: Bowel sounds are normal.  Musculoskeletal:        General: Normal range of motion.  Skin:    General: Skin is warm.     Findings: No rash.  Neurological:     General: No focal deficit present.     Mental Status: She is alert and oriented to person, place, and time.  Psychiatric:        Mood and Affect: Mood normal.        Behavior: Behavior normal.    BP 127/80    Pulse (!) 109    Temp 98.4 F (36.9 C)    Ht '5\' 1"'  (1.549 m)    Wt  188 lb (85.3 kg)    SpO2 96%    BMI 35.52 kg/m  Wt Readings from Last 3 Encounters:  04/21/21 188 lb (85.3 kg)  01/15/21 188 lb 6.4 oz (85.5 kg)  05/26/19 184 lb (83.5 kg)  Health Maintenance Due  Topic Date Due   COVID-19 Vaccine (3 - Booster for Pfizer series) 06/09/2019    There are no preventive care reminders to display for this patient.   Lab Results  Component Value Date   TSH 1.480 05/16/2018   Lab Results  Component Value Date   WBC 4.4 01/17/2021   HGB 12.7 01/17/2021   HCT 36.7 01/17/2021   MCV 87 01/17/2021   PLT 211 01/17/2021   Lab Results  Component Value Date   NA 139 01/17/2021   K 4.4 01/17/2021   CO2 23 01/17/2021   GLUCOSE 87 01/17/2021   BUN 13 01/17/2021   CREATININE 0.99 01/17/2021   BILITOT 0.4 01/17/2021   ALKPHOS 87 01/17/2021   AST 16 01/17/2021   ALT 23 01/17/2021   PROT 7.0 01/17/2021   ALBUMIN 4.6 01/17/2021   CALCIUM 9.0 01/17/2021   EGFR 72 01/17/2021   Lab Results  Component Value Date   CHOL 226 (H) 01/17/2021   Lab Results  Component Value Date   HDL 27 (L) 01/17/2021   Lab Results  Component Value Date   LDLCALC 95 01/17/2021   Lab Results  Component Value Date   TRIG 623 (HH) 01/17/2021   Lab Results  Component Value Date   CHOLHDL 8.4 (H) 01/17/2021   No results found for: HGBA1C     Assessment & Plan:  Take meds as prescribed - Use a cool mist humidifier  -Use saline nose sprays frequently -Force fluids -For fever or aches or pains- take Tylenol or ibuprofen. -Azithromycin 500 mg tablet by mouth day 1, 250 mg tablet  by mouth day 2-5 -If symptoms do not improve, she may need to be COVID tested to rule this out Follow up with worsening unresolved symptoms  Problem List Items Addressed This Visit   None Visit Diagnoses     Upper respiratory tract infection, unspecified type       Relevant Medications   brompheniramine-pseudoephedrine-DM 30-2-10 MG/5ML syrup   azithromycin (ZITHROMAX) 250 MG  tablet        Meds ordered this encounter  Medications   brompheniramine-pseudoephedrine-DM 30-2-10 MG/5ML syrup    Sig: Take 5 mLs by mouth 4 (four) times daily as needed.    Dispense:  120 mL    Refill:  0    Order Specific Question:   Supervising Provider    Answer:   Claretta Fraise [[id number]]   azithromycin (ZITHROMAX) 250 MG tablet    Sig: Take 2 tablets on day 1, then 1 tablet daily on days 2 through 5    Dispense:  6 tablet    Refill:  0    Order Specific Question:   Supervising Provider    Answer:   Claretta Fraise [[id number]]     Ivy Lynn, NP

## 2021-05-12 ENCOUNTER — Other Ambulatory Visit (HOSPITAL_COMMUNITY): Payer: Self-pay

## 2021-06-05 ENCOUNTER — Other Ambulatory Visit (HOSPITAL_COMMUNITY): Payer: Self-pay

## 2021-06-09 ENCOUNTER — Telehealth: Payer: Self-pay | Admitting: *Deleted

## 2021-06-09 NOTE — Telephone Encounter (Signed)
Key: [id number] - PA Case ID: [id number] - Rx #: [id number] ? ?Drug ?Eletriptan Hydrobromide 40MG  tablets ?Form ?MedImpact ePA Form 2017 NCPDP ?Original Claim Info ?608,70 TRANS FEE = 0.00TRIAL OF ORAL SUMATRIPTAN OR RIZATRIPTAN REQUIRED IN PREVIOUS 180 DAYSDRUG EXCLUDED FROM COVERAGECONSIDER GENERIC ALTERNATIVES: RIZATRIPTAN TABLET OR SUMATRIPTAN TABLET. ? ?Sent to plan ?

## 2021-06-11 ENCOUNTER — Other Ambulatory Visit (HOSPITAL_COMMUNITY): Payer: Self-pay

## 2021-06-12 ENCOUNTER — Other Ambulatory Visit (HOSPITAL_COMMUNITY): Payer: Self-pay

## 2021-06-12 NOTE — Telephone Encounter (Signed)
DENIED, NOT COVERED UNDER PHARMACY BENEFIT ? ?FORMULARY ALTERNATIVES: SUMATRIPTAN AND RIZATRIPTAN ?

## 2021-06-13 ENCOUNTER — Other Ambulatory Visit: Payer: Self-pay | Admitting: Family Medicine

## 2021-06-13 MED ORDER — RIZATRIPTAN BENZOATE 10 MG PO TABS: 10.0000 mg | ORAL_TABLET | ORAL | 5 refills | Status: DC | PRN

## 2021-06-13 NOTE — Telephone Encounter (Signed)
I sent in a substitute. ?

## 2021-06-18 ENCOUNTER — Ambulatory Visit (HOSPITAL_BASED_OUTPATIENT_CLINIC_OR_DEPARTMENT_OTHER): Payer: Commercial Managed Care - POS | Admitting: Surgery

## 2021-07-04 ENCOUNTER — Other Ambulatory Visit (INDEPENDENT_AMBULATORY_CARE_PROVIDER_SITE_OTHER): Payer: Self-pay | Admitting: Nurse Practitioner

## 2021-08-20 DIAGNOSIS — H524 Presbyopia: Secondary | ICD-10-CM | POA: Diagnosis not present

## 2021-08-20 DIAGNOSIS — H5213 Myopia, bilateral: Secondary | ICD-10-CM | POA: Diagnosis not present

## 2021-08-20 DIAGNOSIS — H52223 Regular astigmatism, bilateral: Secondary | ICD-10-CM | POA: Diagnosis not present

## 2021-10-10 ENCOUNTER — Encounter: Payer: Self-pay | Admitting: Family Medicine

## 2021-10-10 ENCOUNTER — Ambulatory Visit: Payer: 59 | Admitting: Family Medicine

## 2021-10-10 VITALS — BP 137/92 | HR 73 | Temp 98.0°F | Ht 61.0 in | Wt 187.4 lb

## 2021-10-10 DIAGNOSIS — J0140 Acute pansinusitis, unspecified: Secondary | ICD-10-CM | POA: Diagnosis not present

## 2021-10-10 MED ORDER — DOXYCYCLINE HYCLATE 100 MG PO TABS: 100.0000 mg | ORAL_TABLET | Freq: Two times a day (BID) | ORAL | 0 refills | Status: AC

## 2021-10-10 NOTE — Progress Notes (Signed)
Assessment & Plan:  1. Acute non-recurrent pansinusitis Education provided on sinusitis. Discussed symptom management. - doxycycline (VIBRA-TABS) 100 MG tablet; Take 1 tablet (100 mg total) by mouth 2 (two) times daily for 7 days.  Dispense: 14 tablet; Refill: 0   Follow up plan: Return if symptoms worsen or fail to improve.  Sara Boston, MSN, APRN, FNP-C [facility]  Subjective:   Patient ID: Sara Sellers, female    DOB: [birth year], 46 y.o.   MRN: [id number]  HPI: Sara Sellers is a 46 y.o. female presenting on 10/10/2021 for Ear Pain (Left x 3 days ) and Nasal Congestion (X 3 days)  Patient complains of head congestion, headache, runny nose, ear pain/pressure, and postnasal drainage. Onset of symptoms was 3 days ago, unchanged since that time. She is drinking plenty of fluids. Evaluation to date: none. Treatment to date:  Sudafed, Ibuprofen, Excedrin . She has a history of long-COVID. She does not smoke.    ROS: Negative unless specifically indicated above in HPI.   Relevant past medical history reviewed and updated as indicated.   Allergies and medications reviewed and updated.   Current Outpatient Medications:    albuterol (VENTOLIN HFA) 108 (90 Base) MCG/ACT inhaler, Inhale 2 puffs into the lungs every 6 (six) hours as needed for wheezing or shortness of breath., Disp: 8.5 g, Rfl: 5   brompheniramine-pseudoephedrine-DM 30-2-10 MG/5ML syrup, Take 5 mLs by mouth 4 (four) times daily as needed., Disp: 120 mL, Rfl: 0   fluticasone furoate-vilanterol (BREO ELLIPTA) 100-25 MCG/ACT AEPB, Inhale 1 puff into the lungs daily at 6 (six) AM., Disp: 60 each, Rfl: 2   ipratropium (ATROVENT) 0.03 % nasal spray, Place 2 sprays into both nostrils every 12 (twelve) hours., Disp: 30 mL, Rfl: 11   montelukast (SINGULAIR) 10 MG tablet, Take 1 tablet (10 mg total) by mouth at bedtime., Disp: 90 tablet, Rfl: 3   rizatriptan (MAXALT) 10 MG tablet, Take 1 tablet (10 mg total) by  mouth as needed for migraine. May repeat in 2 hours if needed, Disp: 10 tablet, Rfl: 5   rosuvastatin (CRESTOR) 10 MG tablet, Take 1 tablet (10 mg total) by mouth daily. For cholesterol, Disp: 90 tablet, Rfl: 1  Allergies  Allergen Reactions   Amoxicillin Hives   Septra [Bactrim] Hives   Gemfibrozil     Extreme achiness    Objective:   BP (!) 137/92   Pulse 73   Temp 98 F (36.7 C) (Temporal)   Ht 5\' 1"  (1.549 m)   Wt 187 lb 6.4 oz (85 kg)   SpO2 96%   BMI 35.41 kg/m    Physical Exam Vitals reviewed.  Constitutional:      General: She is not in acute distress.    Appearance: Normal appearance. She is not ill-appearing, toxic-appearing or diaphoretic.  HENT:     Head: Normocephalic and atraumatic.     Right Ear: Tympanic membrane, ear canal and external ear normal. There is no impacted cerumen.     Left Ear: Ear canal and external ear normal. There is no impacted cerumen. Tympanic membrane is bulging.     Nose: No congestion or rhinorrhea.     Right Sinus: No maxillary sinus tenderness or frontal sinus tenderness.     Left Sinus: Maxillary sinus tenderness and frontal sinus tenderness present.     Mouth/Throat:     Mouth: Mucous membranes are moist.     Pharynx: Oropharynx is clear. No oropharyngeal exudate or posterior  oropharyngeal erythema.  Eyes:     General: No scleral icterus.       Right eye: No discharge.        Left eye: No discharge.     Conjunctiva/sclera: Conjunctivae normal.  Cardiovascular:     Rate and Rhythm: Normal rate and regular rhythm.     Heart sounds: Normal heart sounds. No murmur heard.    No friction rub. No gallop.  Pulmonary:     Effort: Pulmonary effort is normal. No respiratory distress.     Breath sounds: Normal breath sounds. No stridor. No wheezing, rhonchi or rales.  Musculoskeletal:        General: Normal range of motion.     Cervical back: Normal range of motion.  Lymphadenopathy:     Cervical: No cervical adenopathy.  Skin:     General: Skin is warm and dry.     Capillary Refill: Capillary refill takes less than 2 seconds.  Neurological:     General: No focal deficit present.     Mental Status: She is alert and oriented to person, place, and time. Mental status is at baseline.  Psychiatric:        Mood and Affect: Mood normal.        Behavior: Behavior normal.        Thought Content: Thought content normal.        Judgment: Judgment normal.

## 2021-10-22 ENCOUNTER — Other Ambulatory Visit: Payer: Self-pay | Admitting: Family Medicine

## 2021-10-23 ENCOUNTER — Other Ambulatory Visit (HOSPITAL_COMMUNITY): Payer: Self-pay

## 2021-10-23 MED ORDER — ROSUVASTATIN CALCIUM 10 MG PO TABS
10.0000 mg | ORAL_TABLET | Freq: Every day | ORAL | 0 refills | Status: DC
  Filled 2021-10-23: qty 90, 90d supply, fill #0

## 2021-10-23 NOTE — Telephone Encounter (Signed)
Stacks NTBS in Nov for yrly PE. RF sent

## 2021-10-23 NOTE — Telephone Encounter (Signed)
Made cpe appt for Jan 20, 2022

## 2021-10-27 ENCOUNTER — Other Ambulatory Visit: Payer: Self-pay | Admitting: *Deleted

## 2021-10-27 ENCOUNTER — Other Ambulatory Visit (HOSPITAL_COMMUNITY): Payer: Self-pay

## 2021-11-27 ENCOUNTER — Other Ambulatory Visit (HOSPITAL_COMMUNITY): Payer: Self-pay

## 2022-01-09 ENCOUNTER — Other Ambulatory Visit (HOSPITAL_COMMUNITY): Payer: Self-pay

## 2022-01-09 MED ORDER — CIPROFLOXACIN HCL 500 MG PO TABS
500.0000 mg | ORAL_TABLET | Freq: Once | ORAL | 0 refills | Status: AC
  Filled 2022-01-09: qty 1, 1d supply, fill #0

## 2022-01-20 ENCOUNTER — Ambulatory Visit (INDEPENDENT_AMBULATORY_CARE_PROVIDER_SITE_OTHER): Payer: 59 | Admitting: Family Medicine

## 2022-01-20 ENCOUNTER — Other Ambulatory Visit: Payer: Self-pay | Admitting: Family Medicine

## 2022-01-20 ENCOUNTER — Other Ambulatory Visit (HOSPITAL_COMMUNITY): Payer: Self-pay

## 2022-01-20 ENCOUNTER — Encounter: Payer: Self-pay | Admitting: Family Medicine

## 2022-01-20 VITALS — BP 128/83 | HR 90 | Temp 98.0°F | Ht 61.0 in | Wt 186.6 lb

## 2022-01-20 DIAGNOSIS — Z0001 Encounter for general adult medical examination with abnormal findings: Secondary | ICD-10-CM | POA: Diagnosis not present

## 2022-01-20 DIAGNOSIS — U099 Post covid-19 condition, unspecified: Secondary | ICD-10-CM | POA: Diagnosis not present

## 2022-01-20 DIAGNOSIS — R0602 Shortness of breath: Secondary | ICD-10-CM

## 2022-01-20 DIAGNOSIS — J069 Acute upper respiratory infection, unspecified: Secondary | ICD-10-CM

## 2022-01-20 DIAGNOSIS — Z0000 Encounter for general adult medical examination without abnormal findings: Secondary | ICD-10-CM | POA: Diagnosis not present

## 2022-01-20 DIAGNOSIS — Z1231 Encounter for screening mammogram for malignant neoplasm of breast: Secondary | ICD-10-CM

## 2022-01-20 LAB — URINALYSIS
Bilirubin, UA: NEGATIVE
Glucose, UA: NEGATIVE
Ketones, UA: NEGATIVE
Leukocytes,UA: NEGATIVE
Nitrite, UA: NEGATIVE
Protein,UA: NEGATIVE
Specific Gravity, UA: 1.02 (ref 1.005–1.030)
Urobilinogen, Ur: 1 mg/dL (ref 0.2–1.0)
pH, UA: 5.5 (ref 5.0–7.5)

## 2022-01-20 MED ORDER — RIZATRIPTAN BENZOATE 10 MG PO TABS
10.0000 mg | ORAL_TABLET | ORAL | 5 refills | Status: DC | PRN
  Filled 2022-01-20: qty 9, 27d supply, fill #0
  Filled 2022-01-20: qty 1, 3d supply, fill #0

## 2022-01-20 MED ORDER — ROSUVASTATIN CALCIUM 10 MG PO TABS
10.0000 mg | ORAL_TABLET | Freq: Every day | ORAL | 3 refills | Status: DC
  Filled 2022-01-20: qty 90, 90d supply, fill #0

## 2022-01-20 NOTE — Progress Notes (Signed)
Subjective:  Patient ID: Sara Sellers, female    DOB: 04-29-1975  Age: 46 y.o. MRN: [id number]  CC: Annual Exam   HPI Sara Sellers presents for annual exam. Off Breo and singulair. Using albuterol with weather changes that make her chest feel tight.      01/20/2022    3:27 PM 04/21/2021   12:13 PM 01/15/2021    1:17 PM  Depression screen PHQ 2/9  Decreased Interest 0 0 0  Down, Depressed, Hopeless 0 0 0  PHQ - 2 Score 0 0 0  Altered sleeping 1 0 1  Tired, decreased energy _0 Change in appetite 0 0 0  Feeling bad or failure about yourself  0 0 0  Trouble concentrating 0 0 0  Moving slowly or fidgety/restless 0 0 0  Suicidal thoughts 0 0 0  PHQ-9 Score _1 Difficult doing work/chores  Not difficult at all Not difficult at all    History Sara Sellers has a past medical history of High triglycerides, Migraines, and Umbilical hernia (95/1884).   She has a past surgical history that includes Dilation and curettage of uterus (01/09/2004); Umbilical hernia repair (N/A, 01/23/2013); Hernia repair (01/23/2013); and Hysteroscopy with novasure.   Her family history includes Cancer in her maternal grandfather and paternal grandmother; Diabetes in her maternal grandfather, maternal grandmother, and mother; Early death in her maternal aunt and maternal uncle; Heart disease in her father, maternal aunt, maternal grandfather, maternal uncle, paternal aunt, paternal grandfather, and paternal uncle; Hyperlipidemia in her brother and brother; Hypertension in her father, maternal grandfather, and mother; Hypothyroidism in her maternal grandmother; Stroke in her paternal grandmother; Uterine cancer in her mother.She reports that she has never smoked. She has never used smokeless tobacco. She reports that she does not drink alcohol and does not use drugs.    ROS Review of Systems  Constitutional:  Negative for appetite change, chills, diaphoresis, fatigue, fever and unexpected weight change.  HENT:   Negative for congestion, ear pain, hearing loss, postnasal drip, rhinorrhea, sneezing, sore throat and trouble swallowing.   Eyes:  Negative for pain.  Respiratory:  Negative for cough, chest tightness and shortness of breath.   Cardiovascular:  Negative for chest pain and palpitations.  Gastrointestinal:  Negative for abdominal pain, constipation, diarrhea, nausea and vomiting.  Endocrine: Negative for cold intolerance, heat intolerance, polydipsia, polyphagia and polyuria.  Genitourinary:  Negative for dysuria, frequency and menstrual problem.  Musculoskeletal:  Positive for arthralgias (right hip sore for a couple of days). Negative for joint swelling.  Skin:  Negative for rash.  Allergic/Immunologic: Negative for environmental allergies.  Neurological:  Positive for headaches (taking maxalt prn for migraine abou once a month.). Negative for dizziness, weakness and numbness.  Psychiatric/Behavioral:  Negative for agitation and dysphoric mood.     Objective:  BP 128/83   Pulse 90   Temp 98 F (36.7 C)   Ht _2  (1.549 m)   Wt 186 lb 9.6 oz (84.6 kg)   SpO2 94%   BMI 35.26 kg/m   BP Readings from Last 3 Encounters:  01/20/22 128/83  10/10/21 (!) 137/92  04/21/21 127/80    Wt Readings from Last 3 Encounters:  01/20/22 186 lb 9.6 oz (84.6 kg)  10/10/21 187 lb 6.4 oz (85 kg)  04/21/21 188 lb (85.3 kg)     Physical Exam Constitutional:      General: She is not in acute distress.    Appearance: She is  well-developed.  HENT:     Head: Normocephalic and atraumatic.  Eyes:     Conjunctiva/sclera: Conjunctivae normal.     Pupils: Pupils are equal, round, and reactive to light.  Neck:     Thyroid: No thyromegaly.  Cardiovascular:     Rate and Rhythm: Normal rate and regular rhythm.     Heart sounds: Normal heart sounds. No murmur heard. Pulmonary:     Effort: Pulmonary effort is normal. No respiratory distress.     Breath sounds: Normal breath sounds. No wheezing or  rales.  Abdominal:     General: Bowel sounds are normal. There is no distension.     Palpations: Abdomen is soft.     Tenderness: There is no abdominal tenderness.  Musculoskeletal:        General: Normal range of motion.     Cervical back: Normal range of motion and neck supple.  Lymphadenopathy:     Cervical: No cervical adenopathy.  Skin:    General: Skin is warm and dry.  Neurological:     Mental Status: She is alert and oriented to person, place, and time.  Psychiatric:        Behavior: Behavior normal.        Thought Content: Thought content normal.        Judgment: Judgment normal.       Assessment & Plan:   Sara Sellers was seen today for annual exam.  Diagnoses and all orders for this visit:  Well adult exam -     Urinalysis -     CBC with Differential/Platelet -     CMP14+EGFR -     Lipid panel  Persistent shortness of breath after COVID-19  Upper respiratory tract infection, unspecified type  Other orders -     Discontinue: rosuvastatin (CRESTOR) 10 MG tablet; Take 1 tablet (10 mg total) by mouth daily. For cholesterol -     rizatriptan (MAXALT) 10 MG tablet; Take 1 tablet (10 mg total) by mouth as needed for migraine. May repeat in 2 hours if needed       I have discontinued Lemmie Evens. Crittendon's montelukast, ipratropium, fluticasone furoate-vilanterol, brompheniramine-pseudoephedrine-DM, and rosuvastatin. I am also having her maintain her albuterol and rizatriptan.  Allergies as of 01/20/2022       Reactions   Amoxicillin Hives   Septra [bactrim] Hives   Gemfibrozil    Extreme achiness        Medication List        Accurate as of January 20, 2022 11:59 PM. If you have any questions, ask your nurse or doctor.          STOP taking these medications    Breo Ellipta 100-25 MCG/ACT Aepb Generic drug: fluticasone furoate-vilanterol Stopped by: Claretta Fraise, MD   brompheniramine-pseudoephedrine-DM 30-2-10 MG/5ML syrup Stopped by: Claretta Fraise,  MD   ipratropium 0.03 % nasal spray Commonly known as: ATROVENT Stopped by: Claretta Fraise, MD   montelukast 10 MG tablet Commonly known as: SINGULAIR Stopped by: Claretta Fraise, MD       TAKE these medications    albuterol 108 (90 Base) MCG/ACT inhaler Commonly known as: VENTOLIN HFA Inhale 2 puffs into the lungs every 6 (six) hours as needed for wheezing or shortness of breath.   rizatriptan 10 MG tablet Commonly known as: Maxalt Take 1 tablet (10 mg total) by mouth as needed for migraine. May repeat in 2 hours if needed   rosuvastatin 10 MG tablet Commonly known as: Crestor Take 1  tablet (10 mg total) by mouth daily. For cholesterol         Follow-up: Return in about 6 months (around 07/21/2022).  Claretta Fraise, M.D.

## 2022-01-21 ENCOUNTER — Other Ambulatory Visit (HOSPITAL_COMMUNITY): Payer: Self-pay

## 2022-01-21 LAB — CBC WITH DIFFERENTIAL/PLATELET
Basophils Absolute: 0.1 10*3/uL (ref 0.0–0.2)
Basos: 1 %
EOS (ABSOLUTE): 0.1 10*3/uL (ref 0.0–0.4)
Eos: 1 %
Hematocrit: 43 % (ref 34.0–46.6)
Hemoglobin: 14.9 g/dL (ref 11.1–15.9)
Immature Grans (Abs): 0 10*3/uL (ref 0.0–0.1)
Immature Granulocytes: 1 %
Lymphocytes Absolute: 2.3 10*3/uL (ref 0.7–3.1)
Lymphs: 36 %
MCH: 31.2 pg (ref 26.6–33.0)
MCHC: 34.7 g/dL (ref 31.5–35.7)
MCV: 90 fL (ref 79–97)
Monocytes Absolute: 0.5 10*3/uL (ref 0.1–0.9)
Monocytes: 8 %
Neutrophils Absolute: 3.4 10*3/uL (ref 1.4–7.0)
Neutrophils: 53 %
Platelets: 319 10*3/uL (ref 150–450)
RBC: 4.78 x10E6/uL (ref 3.77–5.28)
RDW: 12 % (ref 11.7–15.4)
WBC: 6.3 10*3/uL (ref 3.4–10.8)

## 2022-01-21 LAB — CMP14+EGFR
ALT: 33 IU/L — ABNORMAL HIGH (ref 0–32)
AST: 19 IU/L (ref 0–40)
Albumin/Globulin Ratio: 2.1 (ref 1.2–2.2)
Albumin: 4.8 g/dL (ref 3.9–4.9)
Alkaline Phosphatase: 102 IU/L (ref 44–121)
BUN/Creatinine Ratio: 13 (ref 9–23)
BUN: 10 mg/dL (ref 6–24)
Bilirubin Total: 0.3 mg/dL (ref 0.0–1.2)
CO2: 22 mmol/L (ref 20–29)
Calcium: 9 mg/dL (ref 8.7–10.2)
Chloride: 101 mmol/L (ref 96–106)
Creatinine, Ser: 0.8 mg/dL (ref 0.57–1.00)
Globulin, Total: 2.3 g/dL (ref 1.5–4.5)
Glucose: 86 mg/dL (ref 70–99)
Potassium: 4 mmol/L (ref 3.5–5.2)
Sodium: 140 mmol/L (ref 134–144)
Total Protein: 7.1 g/dL (ref 6.0–8.5)
eGFR: 93 mL/min/{1.73_m2} (ref >59)

## 2022-01-21 LAB — LIPID PANEL
Chol/HDL Ratio: 6.8 ratio — ABNORMAL HIGH (ref 0.0–4.4)
Cholesterol, Total: 184 mg/dL (ref 100–199)
HDL: 27 mg/dL — ABNORMAL LOW (ref >39)
Triglycerides: 875 mg/dL (ref 0–149)

## 2022-01-22 ENCOUNTER — Other Ambulatory Visit: Payer: Self-pay | Admitting: Family Medicine

## 2022-01-22 ENCOUNTER — Other Ambulatory Visit (HOSPITAL_COMMUNITY): Payer: Self-pay

## 2022-01-22 MED ORDER — ROSUVASTATIN CALCIUM 20 MG PO TABS
20.0000 mg | ORAL_TABLET | Freq: Every day | ORAL | 1 refills | Status: DC
  Filled 2022-03-05: qty 90, 90d supply, fill #0

## 2022-01-25 ENCOUNTER — Encounter: Payer: Self-pay | Admitting: Family Medicine

## 2022-01-30 ENCOUNTER — Other Ambulatory Visit (HOSPITAL_COMMUNITY): Payer: Self-pay

## 2022-02-06 ENCOUNTER — Inpatient Hospital Stay: Admit: 2022-02-06 | Payer: Self-pay

## 2022-02-06 LAB — HEPATITIS B SURFACE ANTIBODY: HEPATITIS B SURFACE ANTIBODY: <3.31 m[IU]/mL

## 2022-02-06 LAB — VARICELLA ZOSTER ANTIBODY, IGG: Varicella, IgG: 2383 Index

## 2022-02-08 LAB — QUANTIFERON(R)-TB GOLD PLUS
Mitogen-NIL: >10 IU/mL
NIL: 0.02 IU/mL
Quantiferon TB Gold Plus: NEGATIVE
TB1-NIL: 0 IU/mL
TB2-NIL: 0 IU/mL

## 2022-03-04 ENCOUNTER — Other Ambulatory Visit (HOSPITAL_COMMUNITY): Payer: Self-pay

## 2022-03-05 ENCOUNTER — Other Ambulatory Visit (HOSPITAL_COMMUNITY): Payer: Self-pay

## 2022-03-05 MED ORDER — ROSUVASTATIN CALCIUM 20 MG PO TABS
20.0000 mg | ORAL_TABLET | Freq: Every day | ORAL | 1 refills | Status: DC
  Filled 2022-03-05 – 2022-05-30 (×2): qty 90, 90d supply, fill #0
  Filled 2022-09-02: qty 60, 60d supply, fill #1

## 2022-03-06 ENCOUNTER — Other Ambulatory Visit (HOSPITAL_COMMUNITY): Payer: Self-pay

## 2022-03-10 ENCOUNTER — Telehealth (INDEPENDENT_AMBULATORY_CARE_PROVIDER_SITE_OTHER): Payer: 59 | Admitting: Family

## 2022-03-10 ENCOUNTER — Encounter: Payer: Self-pay | Admitting: Family

## 2022-03-10 DIAGNOSIS — U071 COVID-19: Secondary | ICD-10-CM

## 2022-03-10 MED ORDER — BENZONATATE 200 MG PO CAPS: 200.0000 mg | ORAL_CAPSULE | Freq: Three times a day (TID) | ORAL | 1 refills | Status: DC | PRN

## 2022-03-10 MED ORDER — MOLNUPIRAVIR EUA 200MG CAPSULE: 4.0000 | ORAL_CAPSULE | Freq: Two times a day (BID) | ORAL | 0 refills | Status: AC

## 2022-03-10 NOTE — Progress Notes (Signed)
Virtual Visit Consent   Sara Sellers, you are scheduled for a virtual visit with a [facility] provider today. Just as with appointments in the office, your consent must be obtained to participate. Your consent will be active for this visit and any virtual visit you may have with one of our providers in the next 365 days. If you have a MyChart account, a copy of this consent can be sent to you electronically.  As this is a virtual visit, video technology does not allow for your provider to perform a traditional examination. This may limit your provider's ability to fully assess your condition. If your provider identifies any concerns that need to be evaluated in person or the need to arrange testing (such as labs, EKG, etc.), we will make arrangements to do so. Although advances in technology are sophisticated, we cannot ensure that it will always work on either your end or our end. If the connection with a video visit is poor, the visit may have to be switched to a telephone visit. With either a video or telephone visit, we are not always able to ensure that we have a secure connection.  By engaging in this virtual visit, you consent to the provision of healthcare and authorize for your insurance to be billed (if applicable) for the services provided during this visit. Depending on your insurance coverage, you may receive a charge related to this service.  I need to obtain your verbal consent now. Are you willing to proceed with your visit today? Sara Sellers has provided verbal consent on 03/10/2022 for a virtual visit (video or telephone). Evelina Dun, FNP  Date: 03/10/2022 11:51 AM  Virtual Visit via Video Note   I, Evelina Dun, connected with  Sara Sellers  ([id number], 11-15-1975) on 03/10/22 at 11:50 AM EST by a video-enabled telemedicine application and verified that I am speaking with the correct person using two identifiers.  Location: Patient: Virtual Visit Location Patient:  Home Provider: Virtual Visit Location Provider: Office/Clinic   I discussed the limitations of evaluation and management by telemedicine and the availability of in person appointments. The patient expressed understanding and agreed to proceed.    History of Present Illness: Sara Sellers is a 46 y.o. who identifies as a female who was assigned female at birth, and is being seen today for COVID. Reports her symptoms started two days ago and tested positive yesterday.   HPI: Cough This is a new problem. The current episode started in the past 7 days. The problem has been waxing and waning. The problem occurs every few minutes. The cough is Productive of sputum. Associated symptoms include chills, ear congestion, ear pain, a fever, headaches, myalgias, nasal congestion, postnasal drip, rhinorrhea and a sore throat. Pertinent negatives include no shortness of breath or wheezing. She has tried rest and OTC cough suppressant for the symptoms. The treatment provided mild relief.    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   FH: cerebral aneurysm 11/30/2013    Allergies:  Allergies  Allergen Reactions   Amoxicillin Hives   Septra [Bactrim] Hives   Gemfibrozil     Extreme achiness   Medications:  Current Outpatient Medications:    benzonatate (TESSALON)  200 MG capsule, Take 1 capsule (200 mg total) by mouth 3 (three) times daily as needed., Disp: 30 capsule, Rfl: 1   molnupiravir EUA (LAGEVRIO) 200 mg CAPS capsule, Take 4 capsules (800 mg total) by mouth 2 (two) times daily for 5 days., Disp: 40 capsule, Rfl: 0   albuterol (VENTOLIN HFA) 108 (90 Base) MCG/ACT inhaler, Inhale 2 puffs into the lungs every 6 (six) hours as needed for  wheezing or shortness of breath., Disp: 8.5 g, Rfl: 5   rosuvastatin (CRESTOR) 20 MG tablet, Take 1 tablet (20 mg total) by mouth daily for cholestrol, Disp: 90 tablet, Rfl: 1  Observations/Objective: Patient is well-developed, well-nourished in no acute distress.  Resting comfortably  at home.  Head is normocephalic, atraumatic.  No labored breathing.  Speech is clear and coherent with logical content.  Patient is alert and oriented at baseline.    Assessment and Plan: 1. COVID-19 - molnupiravir EUA (LAGEVRIO) 200 mg CAPS capsule; Take 4 capsules (800 mg total) by mouth 2 (two) times daily for 5 days.  Dispense: 40 capsule; Refill: 0 - benzonatate (TESSALON) 200 MG capsule; Take 1 capsule (200 mg total) by mouth 3 (three) times daily as needed.  Dispense: 30 capsule; Refill: 1  COVID positive, rest, force fluids, tylenol as needed, Quarantine for at least 5 days and you are fever free, then must wear a mask out in public from day 6-10, report any worsening symptoms such as increased shortness of breath, swelling, or continued high fevers. Possible adverse effects discussed with antivirals.    Follow Up Instructions: I discussed the assessment and treatment plan with the patient. The patient was provided an opportunity to ask questions and all were answered. The patient agreed with the plan and demonstrated an understanding of the instructions.  A copy of instructions were sent to the patient via MyChart unless otherwise noted below.     The patient was advised to call back or seek an in-person evaluation if the symptoms worsen or if the condition fails to improve as anticipated.  Time:  I spent 6 minutes with the patient via telehealth technology discussing the above problems/concerns.    Jannifer Rodney, FNP

## 2022-03-23 ENCOUNTER — Ambulatory Visit: Payer: 59

## 2022-03-26 ENCOUNTER — Ambulatory Visit
Admission: RE | Admit: 2022-03-26 | Discharge: 2022-03-26 | Disposition: A | Discharge status: Home / Self Care | Payer: Commercial Managed Care - PPO | Source: Ambulatory Visit | Attending: Family Medicine | Admitting: Family Medicine

## 2022-03-26 DIAGNOSIS — Z1231 Encounter for screening mammogram for malignant neoplasm of breast: Secondary | ICD-10-CM | POA: Diagnosis not present

## 2022-05-30 ENCOUNTER — Other Ambulatory Visit (HOSPITAL_COMMUNITY): Payer: Self-pay

## 2022-05-30 ENCOUNTER — Other Ambulatory Visit: Payer: Self-pay

## 2022-06-11 IMAGING — MG MM DIGITAL SCREENING BILAT W/ TOMO AND CAD
8 series · 8 of 24 positions shown · non-contrast
Comparison: Previous exam(s).

CLINICAL DATA: Screening.

EXAM:
DIGITAL SCREENING BILATERAL MAMMOGRAM WITH TOMOSYNTHESIS AND CAD
TECHNIQUE: Bilateral screening digital craniocaudal and mediolateral oblique
mammograms were obtained. Bilateral screening digital breast
tomosynthesis was performed. The images were evaluated with
computer-aided detection.

[R MLO synth-2D]
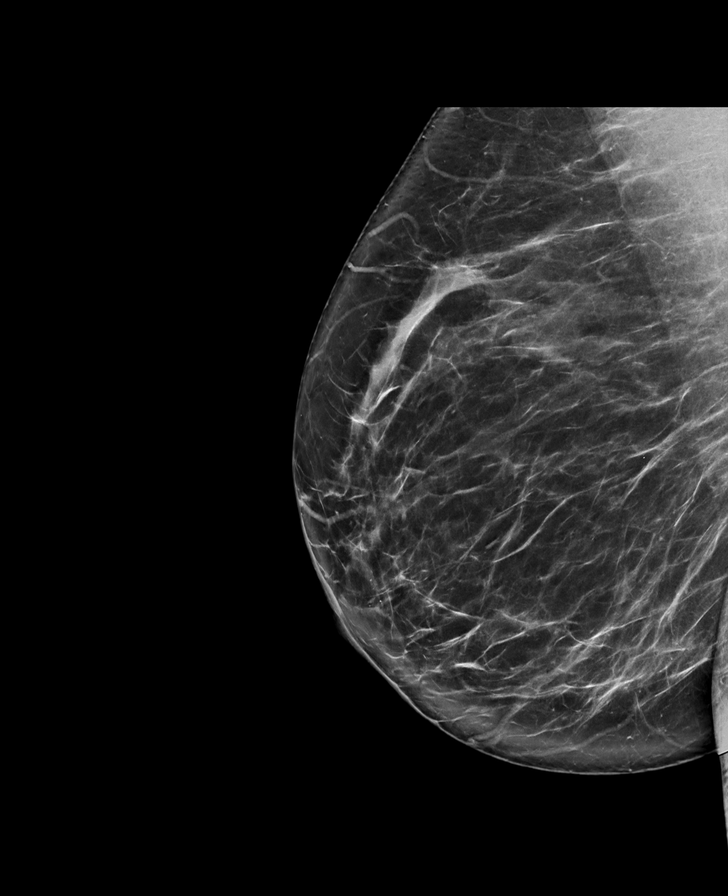

[L MLO synth-2D]
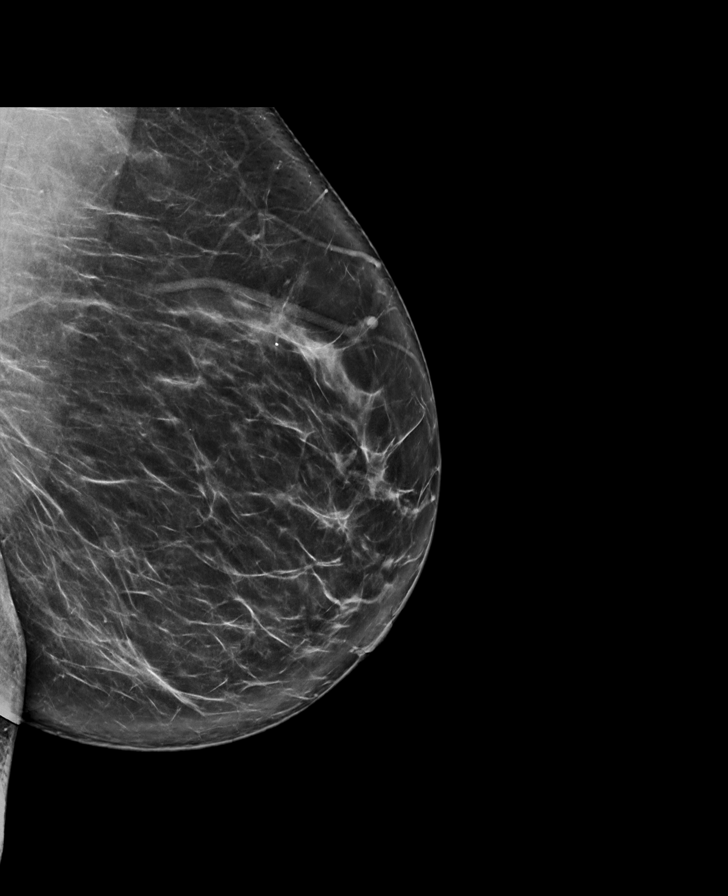

[L CC synth-2D]
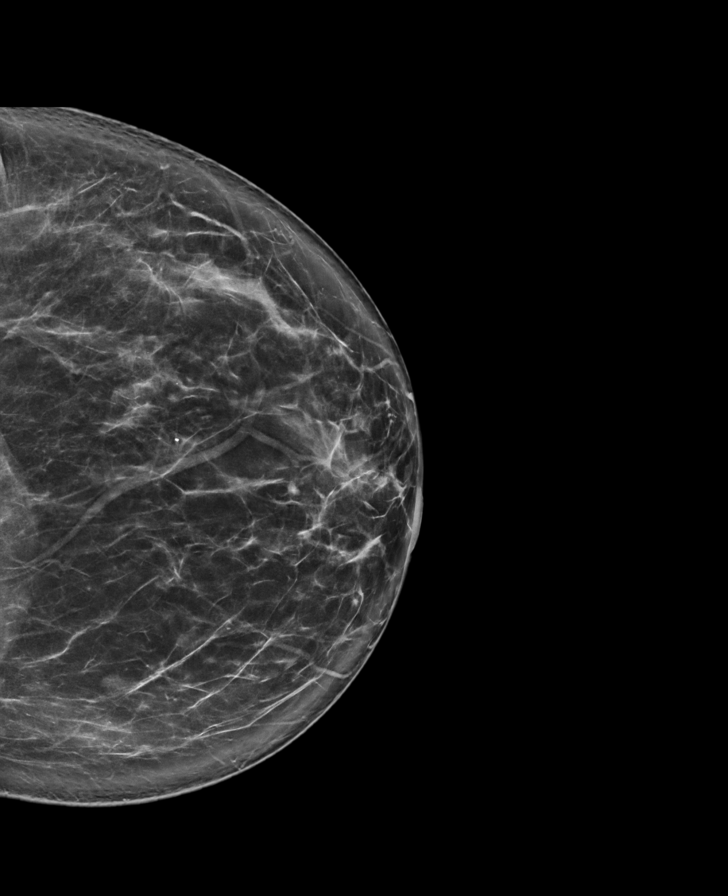

[R CC synth-2D]
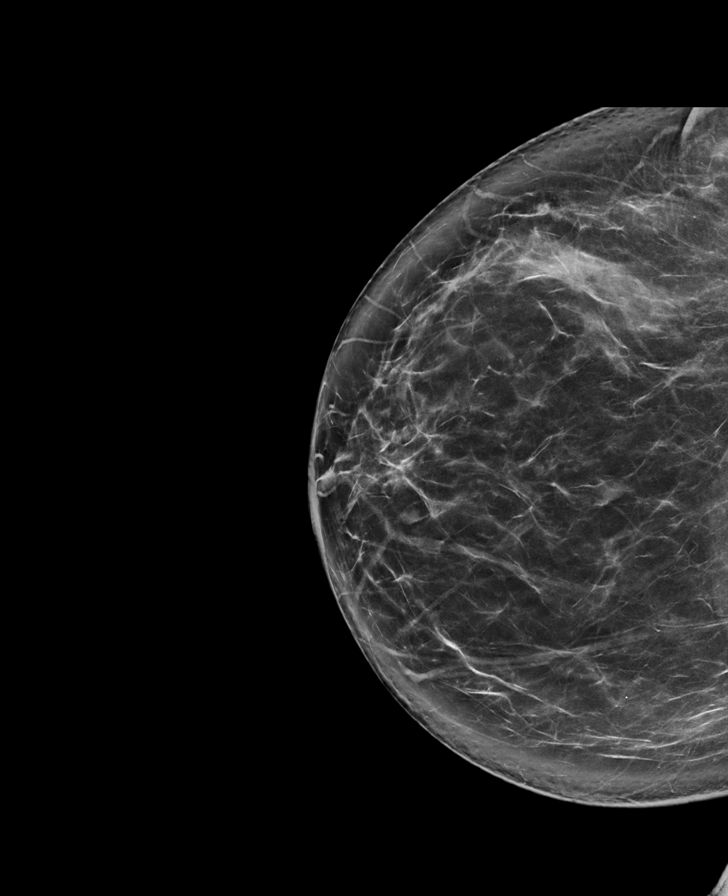

[R MLO tomo · tomo slice 47/93.0]
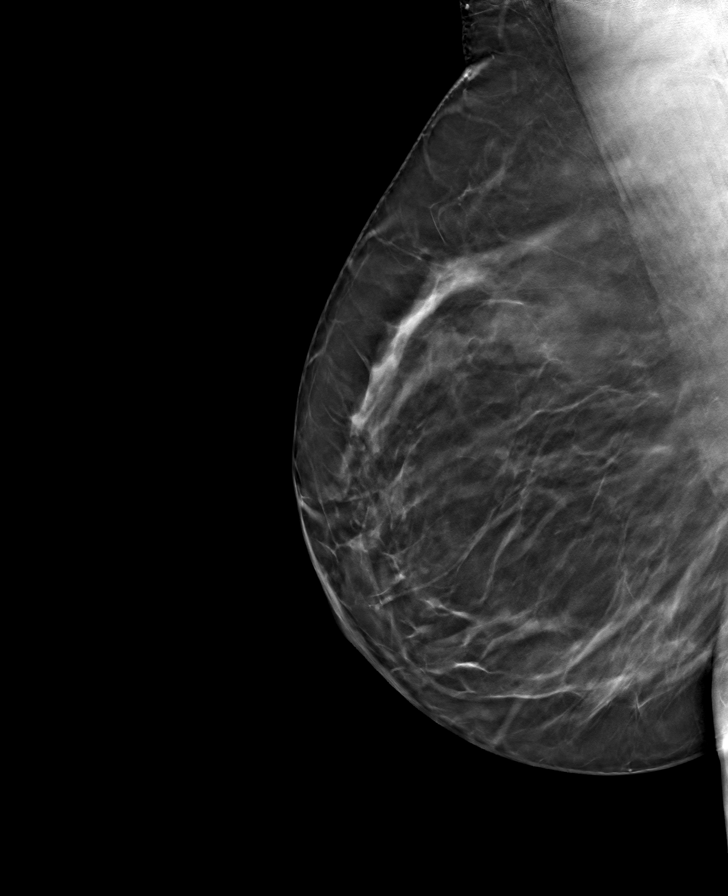

[R CC tomo · tomo slice 47/92.0]
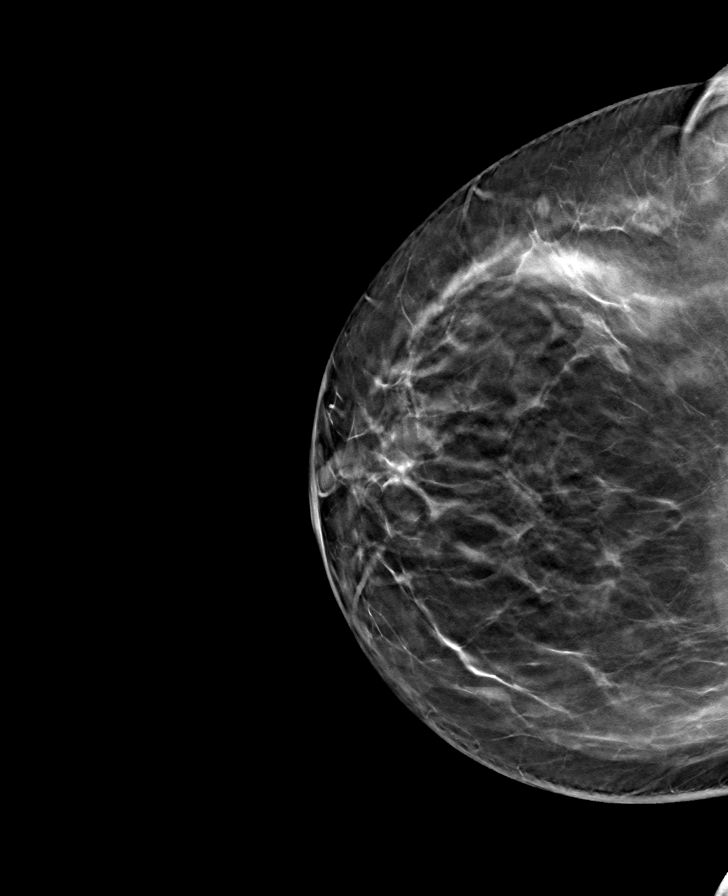

[L CC tomo · tomo slice 43/86.0]
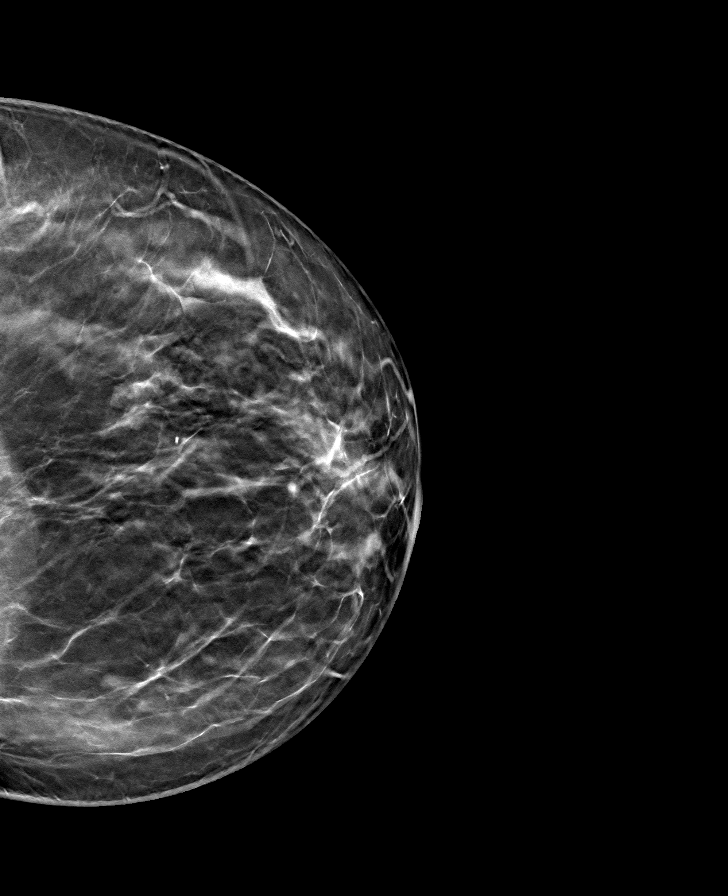

[L MLO tomo · tomo slice 47/93.0]
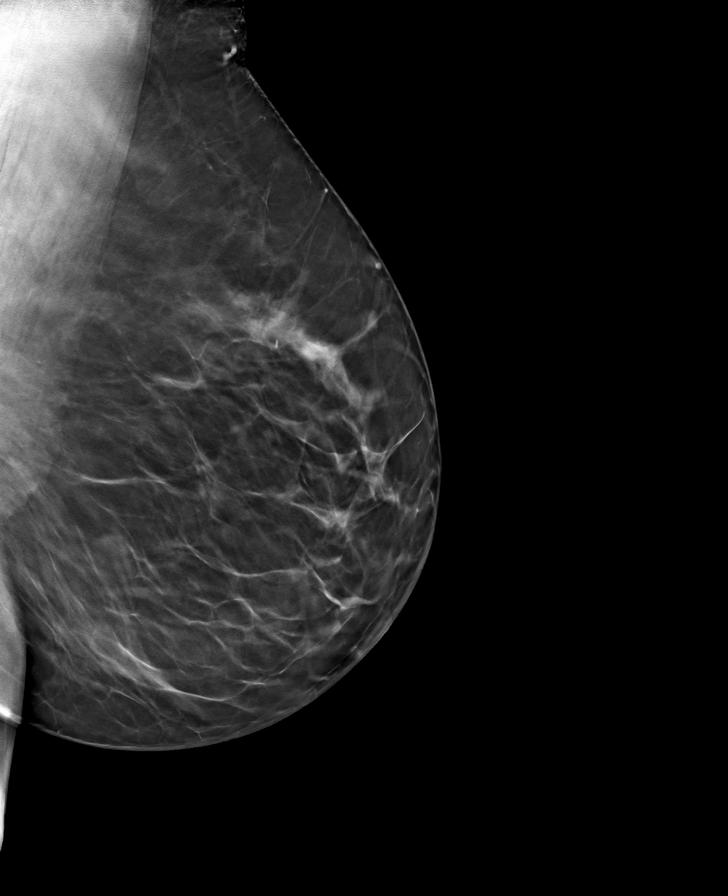

[8 of 24 positions shown; findings below may reference images not displayed]

ACR Breast Density Category b: There are scattered areas of
fibroglandular density.
FINDINGS: There are no findings suspicious for malignancy.
IMPRESSION: No mammographic evidence of malignancy. A result letter of this
screening mammogram will be mailed directly to the patient.

RECOMMENDATION:
Screening mammogram in one year. (Code:51-O-LD2)

BI-RADS CATEGORY  1: Negative.

## 2022-09-03 ENCOUNTER — Other Ambulatory Visit (HOSPITAL_COMMUNITY): Payer: Self-pay

## 2022-09-03 ENCOUNTER — Other Ambulatory Visit: Payer: Self-pay

## 2022-09-18 ENCOUNTER — Other Ambulatory Visit (INDEPENDENT_AMBULATORY_CARE_PROVIDER_SITE_OTHER): Payer: Self-pay | Admitting: Nurse Practitioner

## 2022-11-02 ENCOUNTER — Other Ambulatory Visit: Payer: Self-pay

## 2022-11-02 ENCOUNTER — Other Ambulatory Visit (HOSPITAL_COMMUNITY): Payer: Self-pay

## 2022-11-02 ENCOUNTER — Ambulatory Visit: Payer: Commercial Managed Care - PPO | Admitting: Family Medicine

## 2022-11-02 VITALS — BP 187/82 | HR 81 | Temp 98.2°F | Ht 61.0 in | Wt 191.2 lb

## 2022-11-02 DIAGNOSIS — E782 Mixed hyperlipidemia: Secondary | ICD-10-CM | POA: Diagnosis not present

## 2022-11-02 DIAGNOSIS — R0602 Shortness of breath: Secondary | ICD-10-CM

## 2022-11-02 DIAGNOSIS — U099 Post covid-19 condition, unspecified: Secondary | ICD-10-CM

## 2022-11-02 MED ORDER — ALBUTEROL SULFATE HFA 108 (90 BASE) MCG/ACT IN AERS
2.0000 | INHALATION_SPRAY | Freq: Four times a day (QID) | RESPIRATORY_TRACT | 5 refills | Status: AC | PRN
  Filled 2022-11-02 – 2023-07-23 (×2): qty 6.7, 25d supply, fill #0

## 2022-11-02 MED ORDER — ROSUVASTATIN CALCIUM 20 MG PO TABS
20.0000 mg | ORAL_TABLET | Freq: Every day | ORAL | 1 refills | Status: DC
  Filled 2022-11-02 – 2022-12-22 (×2): qty 90, 90d supply, fill #0

## 2022-11-02 NOTE — Progress Notes (Signed)
`  Subjective:  Patient ID: Sara Sellers, female    DOB: [birth year]  Age: 47 y.o. MRN: [id number]  CC: Medical Management of Chronic Issues   HPI Sara Sellers presents for  in for follow-up of elevated cholesterol. Doing well without complaints on current medication. Denies side effects of statin including myalgia and arthralgia and nausea. Currently no chest pain, shortness of breath or other cardiovascular related symptoms noted.      11/02/2022    9:36 AM 01/20/2022    3:27 PM 04/21/2021   12:13 PM  Depression screen PHQ 2/9  Decreased Interest 0 0 0  Down, Depressed, Hopeless 0 0 0  PHQ - 2 Score 0 0 0  Altered sleeping  1 0  Tired, decreased energy  1 1  Change in appetite  0 0  Feeling bad or failure about yourself   0 0  Trouble concentrating  0 0  Moving slowly or fidgety/restless  0 0  Suicidal thoughts  0 0  PHQ-9 Score  2 1  Difficult doing work/chores   Not difficult at all    History Sara Sellers has a past medical history of High triglycerides, Migraines, and Umbilical hernia (01/2013).   She has a past surgical history that includes Dilation and curettage of uterus (01/09/2004); Umbilical hernia repair (N/A, 01/23/2013); Hernia repair (01/23/2013); and Hysteroscopy with novasure.   Her family history includes Cancer in her maternal grandfather and paternal grandmother; Diabetes in her maternal grandfather, maternal grandmother, and mother; Early death in her maternal aunt and maternal uncle; Heart disease in her father, maternal aunt, maternal grandfather, maternal uncle, paternal aunt, paternal grandfather, and paternal uncle; Hyperlipidemia in her brother and brother; Hypertension in her father, maternal grandfather, and mother; Hypothyroidism in her maternal grandmother; Stroke in her paternal grandmother; Uterine cancer in her mother.She reports that she has never smoked. She has never used smokeless tobacco. She reports that she does not drink alcohol and does not use  drugs.    ROS Review of Systems  Objective:  BP (!) 187/82   Pulse 81   Temp 98.2 F (36.8 C)   Ht 5\' 1"  (1.549 m)   Wt 191 lb 3.2 oz (86.7 kg)   SpO2 97%   BMI 36.13 kg/m   BP Readings from Last 3 Encounters:  11/02/22 (!) 187/82  01/20/22 128/83  10/10/21 (!) 137/92    Wt Readings from Last 3 Encounters:  11/02/22 191 lb 3.2 oz (86.7 kg)  01/20/22 186 lb 9.6 oz (84.6 kg)  10/10/21 187 lb 6.4 oz (85 kg)     Physical Exam    Assessment & Plan:   Sara Sellers was seen today for medical management of chronic issues.  Diagnoses and all orders for this visit:  Mixed hyperlipidemia -     CMP14+EGFR -     Lipid panel  Persistent shortness of breath after COVID-19 -     albuterol (VENTOLIN HFA) 108 (90 Base) MCG/ACT inhaler; Inhale 2 puffs into the lungs every 6 (six) hours as needed for wheezing or shortness of breath.  Other orders -     rosuvastatin (CRESTOR) 20 MG tablet; Take 1 tablet (20 mg total) by mouth daily for cholestrol       I have discontinued Elnita Maxwell. Kluge's benzonatate. I am also having her maintain her albuterol and rosuvastatin.  Allergies as of 11/02/2022       Reactions   Amoxicillin Hives   Septra [bactrim] Hives   Gemfibrozil  Extreme achiness        Medication List        Accurate as of November 02, 2022 10:16 AM. If you have any questions, ask your nurse or doctor.          STOP taking these medications    benzonatate 200 MG capsule Commonly known as: TESSALON Stopped by: Joal Eakle       TAKE these medications    albuterol 108 (90 Base) MCG/ACT inhaler Commonly known as: VENTOLIN HFA Inhale 2 puffs into the lungs every 6 (six) hours as needed for wheezing or shortness of breath.   rosuvastatin 20 MG tablet Commonly known as: CRESTOR Take 1 tablet (20 mg total) by mouth daily for cholestrol         Follow-up: Return in about 3 months (around 02/02/2023) for [facility] physical.  Mechele Claude, M.D.

## 2022-11-03 ENCOUNTER — Encounter: Payer: Self-pay | Admitting: Pharmacist

## 2022-11-03 ENCOUNTER — Other Ambulatory Visit: Payer: Self-pay

## 2022-11-03 LAB — CMP14+EGFR
ALT: 22 IU/L (ref 0–32)
AST: 17 IU/L (ref 0–40)
Albumin: 4.8 g/dL (ref 3.9–4.9)
Alkaline Phosphatase: 93 IU/L (ref 44–121)
BUN/Creatinine Ratio: 10 (ref 9–23)
BUN: 9 mg/dL (ref 6–24)
Bilirubin Total: 0.4 mg/dL (ref 0.0–1.2)
CO2: 21 mmol/L (ref 20–29)
Calcium: 9.1 mg/dL (ref 8.7–10.2)
Chloride: 105 mmol/L (ref 96–106)
Creatinine, Ser: 0.94 mg/dL (ref 0.57–1.00)
Globulin, Total: 2.3 g/dL (ref 1.5–4.5)
Glucose: 89 mg/dL (ref 70–99)
Potassium: 4.4 mmol/L (ref 3.5–5.2)
Sodium: 142 mmol/L (ref 134–144)
Total Protein: 7.1 g/dL (ref 6.0–8.5)
eGFR: 76 mL/min/{1.73_m2} (ref >59)

## 2022-11-03 LAB — LIPID PANEL
Chol/HDL Ratio: 5.4 ratio — ABNORMAL HIGH (ref 0.0–4.4)
Cholesterol, Total: 162 mg/dL (ref 100–199)
HDL: 30 mg/dL — ABNORMAL LOW (ref >39)
LDL Chol Calc (NIH): 72 mg/dL (ref 0–99)
Triglycerides: 381 mg/dL — ABNORMAL HIGH (ref 0–149)
VLDL Cholesterol Cal: 60 mg/dL — ABNORMAL HIGH (ref 5–40)

## 2022-11-06 ENCOUNTER — Other Ambulatory Visit: Payer: Self-pay

## 2022-11-30 ENCOUNTER — Other Ambulatory Visit: Payer: Self-pay

## 2022-12-08 ENCOUNTER — Other Ambulatory Visit: Payer: Self-pay | Admitting: Nurse Practitioner

## 2022-12-10 ENCOUNTER — Other Ambulatory Visit (INDEPENDENT_AMBULATORY_CARE_PROVIDER_SITE_OTHER): Payer: Self-pay | Admitting: Nurse Practitioner

## 2022-12-22 ENCOUNTER — Other Ambulatory Visit (HOSPITAL_COMMUNITY): Payer: Self-pay

## 2022-12-25 ENCOUNTER — Other Ambulatory Visit: Payer: Self-pay | Admitting: Nurse Practitioner

## 2022-12-31 ENCOUNTER — Ambulatory Visit (INDEPENDENT_AMBULATORY_CARE_PROVIDER_SITE_OTHER): Payer: Commercial Managed Care - PPO

## 2022-12-31 ENCOUNTER — Ambulatory Visit: Payer: Commercial Managed Care - PPO | Admitting: Nurse Practitioner

## 2022-12-31 ENCOUNTER — Encounter: Payer: Self-pay | Admitting: Nurse Practitioner

## 2022-12-31 VITALS — BP 131/83 | HR 72 | Temp 97.7°F | Ht 61.0 in | Wt 191.6 lb

## 2022-12-31 DIAGNOSIS — M25551 Pain in right hip: Secondary | ICD-10-CM | POA: Diagnosis not present

## 2022-12-31 MED ORDER — METHYLPREDNISOLONE ACETATE 40 MG/ML IJ SUSP
40.0000 mg | Freq: Once | INTRAMUSCULAR | Status: AC
Start: 2022-12-31 — End: 2022-12-31
  Administered 2022-12-31: 40 mg via INTRAMUSCULAR

## 2022-12-31 MED ORDER — DICLOFENAC SODIUM 25 MG PO TBEC
25.0000 mg | DELAYED_RELEASE_TABLET | Freq: Two times a day (BID) | ORAL | 0 refills | Status: DC
Start: 2022-12-31 — End: 2023-04-05

## 2022-12-31 NOTE — Progress Notes (Signed)
Acute Office Visit  Subjective:     Patient ID: Sara Sellers, female    DOB: 01/29/1976, 47 y.o.   MRN: [id number]  Chief Complaint  Patient presents with   Hip Pain    Right hip pain for 3 months. Has been doing stretches, ice, heat, medications not getting any better    HPI reports started 41-months ago  and has ben doing streching  Pain  She reports chronic left hip pain. was not an injury that may have caused the pain. The pain started  3 months ago and is nearly resolved. The pain does radiate . The pain is described as sharp, is 3/10 in intensity, occurring intermittently. Symptoms are worse in the: not difference  Aggravating factors: walking Relieving factors: bending backwards and walking.  She has tried application of heat and NSAIDs with mild relief.   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  HENT:  Negative for ear pain and sore throat.   Respiratory:  Negative for cough and shortness of breath.   Cardiovascular:  Negative for chest pain and leg swelling.  Genitourinary:  Negative for frequency.  Musculoskeletal:  Positive for joint pain.       Left hip  Skin:  Negative for itching and rash.  Neurological:  Negative for dizziness and headaches.   Negative unless indicated in HPI    Objective:    BP 131/83   Pulse 72   Temp 97.7 F (36.5 C) (Temporal)   Ht 5\' 1"  (1.549 m)   Wt 191 lb 9.6 oz (86.9 kg)    SpO2 96%   BMI 36.20 kg/m  BP Readings from Last 3 Encounters:  12/31/22 131/83  11/02/22 (!) 187/82  01/20/22 128/83   Wt Readings from Last 3 Encounters:  12/31/22 191 lb 9.6 oz (86.9 kg)  11/02/22 191 lb 3.2 oz (86.7 kg)  01/20/22 186 lb 9.6 oz (84.6 kg)      Physical Exam Vitals and nursing note reviewed.  Constitutional:      Appearance: Normal appearance.  HENT:     Head: Normocephalic and atraumatic.  Eyes:     General: No scleral icterus.    Extraocular Movements: Extraocular movements intact.     Conjunctiva/sclera: Conjunctivae normal.     Pupils: Pupils are equal, round, and reactive to light.  Pulmonary:     Effort: Pulmonary effort is normal.     Breath sounds: Normal breath sounds.  Abdominal:     General: Bowel sounds are normal.     Palpations: Abdomen is soft.  Musculoskeletal:     Cervical back: Normal range of motion and neck supple.     Right hip: Normal.     Left hip: No bony tenderness. Normal range of motion. Normal strength.  Skin:    General: Skin is warm and dry.     Findings: No rash.  Neurological:  Mental Status: She is alert and oriented to person, place, and time. Mental status is at baseline.  Psychiatric:        Mood and Affect: Mood normal.        Behavior: Behavior normal.        Thought Content: Thought content normal.        Judgment: Judgment normal.    No results found for any visits on 12/31/22.      Assessment & Plan:  Pain of right hip -     DG HIP UNILAT W OR W/O PELVIS 2-3 VIEWS RIGHT -     methylPREDNISolone Acetate -     Diclofenac Sodium; Take 1 tablet (25 mg total) by mouth 2 (two) times daily.  Dispense: 30 tablet; Refill: 0  Sara Sellers is 47 yrs old female, no acute distress Left hip pain Methylprednisolone 40 mg IM  Diclofenac 25 mg BID PRN take with food A left hip X-Ray was ordered. My reading of this film is normal. (No comparison films available: pending review by Radiologist.)    The above assessment  and management plan was discussed with the patient. The patient verbalized understanding of and has agreed to the management plan. Patient is aware to call the clinic if they develop any new symptoms or if symptoms persist or worsen. Patient is aware when to return to the clinic for a follow-up visit. Patient educated on when it is appropriate to go to the emergency department.  Return if symptoms worsen or fail to improve.  [name], DNP [facility] [address] [place], [place] [address] [phone]

## 2023-01-04 ENCOUNTER — Other Ambulatory Visit: Payer: Self-pay

## 2023-01-27 ENCOUNTER — Other Ambulatory Visit: Payer: Self-pay | Admitting: Dermatology

## 2023-01-29 ENCOUNTER — Other Ambulatory Visit: Payer: Self-pay

## 2023-02-03 ENCOUNTER — Other Ambulatory Visit (HOSPITAL_COMMUNITY): Payer: Self-pay

## 2023-02-03 ENCOUNTER — Encounter: Payer: Self-pay | Admitting: Family Medicine

## 2023-02-03 ENCOUNTER — Ambulatory Visit (INDEPENDENT_AMBULATORY_CARE_PROVIDER_SITE_OTHER): Payer: Commercial Managed Care - PPO | Admitting: Family Medicine

## 2023-02-03 VITALS — BP 130/80 | HR 78 | Temp 97.5°F | Ht 61.0 in | Wt 190.0 lb

## 2023-02-03 DIAGNOSIS — Z0001 Encounter for general adult medical examination with abnormal findings: Secondary | ICD-10-CM

## 2023-02-03 DIAGNOSIS — Z6833 Body mass index (BMI) 33.0-33.9, adult: Secondary | ICD-10-CM | POA: Diagnosis not present

## 2023-02-03 DIAGNOSIS — E782 Mixed hyperlipidemia: Secondary | ICD-10-CM

## 2023-02-03 DIAGNOSIS — G43009 Migraine without aura, not intractable, without status migrainosus: Secondary | ICD-10-CM | POA: Diagnosis not present

## 2023-02-03 DIAGNOSIS — Z1211 Encounter for screening for malignant neoplasm of colon: Secondary | ICD-10-CM

## 2023-02-03 DIAGNOSIS — Z0000 Encounter for general adult medical examination without abnormal findings: Secondary | ICD-10-CM

## 2023-02-03 MED ORDER — ROSUVASTATIN CALCIUM 20 MG PO TABS
20.0000 mg | ORAL_TABLET | Freq: Every day | ORAL | 1 refills | Status: DC
  Filled 2023-02-03 – 2023-03-18 (×2): qty 90, 90d supply, fill #0
  Filled 2023-07-23: qty 90, 90d supply, fill #1

## 2023-02-03 NOTE — Progress Notes (Signed)
Subjective:  Patient ID: Sara Sellers, female    DOB: 05/27/1975  Age: 47 y.o. MRN: [id number]  CC: Annual Exam   HPI Sara Sellers presents for CPE, no pap or breast eam. Mammo due in Jan 2025. Pt. Will schedule.     02/03/2023    3:11 PM 12/31/2022    4:03 PM 11/02/2022    9:36 AM  Depression screen PHQ 2/9  Decreased Interest 0 0 0  Down, Depressed, Hopeless 0 0 0  PHQ - 2 Score 0 0 0  Altered sleeping  0   Tired, decreased energy  1   Change in appetite  0   Feeling bad or failure about yourself   0   Trouble concentrating  0   Moving slowly or fidgety/restless  0   Suicidal thoughts  0   PHQ-9 Score  1   Difficult doing work/chores  Not difficult at all     History Sara Sellers has a past medical history of High triglycerides, Migraines, and Umbilical hernia (01/2013).   She has a past surgical history that includes Dilation and curettage of uterus (01/09/2004); Umbilical hernia repair (N/A, 01/23/2013); Hernia repair (01/23/2013); and Hysteroscopy with novasure.   Her family history includes Cancer in her maternal grandfather and paternal grandmother; Diabetes in her maternal grandfather, maternal grandmother, and mother; Early death in her maternal aunt and maternal uncle; Heart disease in her father, maternal aunt, maternal grandfather, maternal uncle, paternal aunt, paternal grandfather, and paternal uncle; Hyperlipidemia in her brother and brother; Hypertension in her father, maternal grandfather, and mother; Hypothyroidism in her maternal grandmother; Stroke in her paternal grandmother; Uterine cancer in her mother.She reports that she has never smoked. She has never used smokeless tobacco. She reports that she does not drink alcohol and does not use drugs.    ROS Review of Systems  Constitutional: Negative.   HENT:  Negative for congestion.   Eyes:  Negative for visual disturbance.  Respiratory:  Negative for shortness of breath.   Cardiovascular:  Negative for chest  pain.  Gastrointestinal:  Negative for abdominal pain, constipation, diarrhea, nausea and vomiting.  Genitourinary:  Negative for difficulty urinating.  Musculoskeletal:  Negative for arthralgias and myalgias.  Neurological:  Positive for headaches.  Psychiatric/Behavioral:  Negative for sleep disturbance.     Objective:  BP 130/80   Pulse 78   Temp (!) 97.5 F (36.4 C)   Ht 5\' 1"  (1.549 m)   Wt 190 lb (86.2 kg)   SpO2 97%   BMI 35.90 kg/m   BP Readings from Last 3 Encounters:  02/03/23 130/80  12/31/22 131/83  11/02/22 (!) 187/82    Wt Readings from Last 3 Encounters:  02/03/23 190 lb (86.2 kg)  12/31/22 191 lb 9.6 oz (86.9 kg)  11/02/22 191 lb 3.2 oz (86.7 kg)     Physical Exam Constitutional:      General: She is not in acute distress.    Appearance: She is well-developed.  HENT:     Head: Normocephalic and atraumatic.  Eyes:     Conjunctiva/sclera: Conjunctivae normal.     Pupils: Pupils are equal, round, and reactive to light.  Neck:     Thyroid: No thyromegaly.  Cardiovascular:     Rate and Rhythm: Normal rate and regular rhythm.     Heart sounds: Normal heart sounds. No murmur heard. Pulmonary:     Effort: Pulmonary effort is normal. No respiratory distress.     Breath sounds: Normal breath sounds.  No wheezing or rales.  Abdominal:     General: Bowel sounds are normal. There is no distension.     Palpations: Abdomen is soft.     Tenderness: There is no abdominal tenderness.  Musculoskeletal:        General: Normal range of motion.     Cervical back: Normal range of motion and neck supple.  Lymphadenopathy:     Cervical: No cervical adenopathy.  Skin:    General: Skin is warm and dry.  Neurological:     Mental Status: She is alert and oriented to person, place, and time.  Psychiatric:        Behavior: Behavior normal.        Thought Content: Thought content normal.        Judgment: Judgment normal.       Assessment & Plan:   Sara Sellers was seen  today for annual exam.  Diagnoses and all orders for this visit:  Well adult exam -     CBC with Differential/Platelet -     CMP14+EGFR -     Lipid panel -     Urinalysis  BMI 33.0-33.9,adult  Mixed hyperlipidemia  Screen for colon cancer -     Ambulatory referral to Gastroenterology  Migraine without aura and without status migrainosus, not intractable  Other orders -     rosuvastatin (CRESTOR) 20 MG tablet; Take 1 tablet (20 mg total) by mouth daily for cholesterol       I am having Sara Sellers maintain her albuterol, rizatriptan, diclofenac, and rosuvastatin.  Allergies as of 02/03/2023       Reactions   Amoxicillin Hives   Septra [bactrim] Hives   Gemfibrozil    Extreme achiness        Medication List        Accurate as of February 03, 2023  4:01 PM. If you have any questions, ask your nurse or doctor.          albuterol 108 (90 Base) MCG/ACT inhaler Commonly known as: VENTOLIN HFA Inhale 2 puffs into the lungs every 6 (six) hours as needed for wheezing or shortness of breath.   diclofenac 25 MG EC tablet Commonly known as: VOLTAREN Take 1 tablet (25 mg total) by mouth 2 (two) times daily.   rizatriptan 5 MG tablet Commonly known as: MAXALT Take 10 mg by mouth as needed for migraine. May repeat in 2 hours if needed   rosuvastatin 20 MG tablet Commonly known as: CRESTOR Take 1 tablet (20 mg total) by mouth daily for cholesterol         Follow-up: Return in about 1 year (around 02/03/2024) for [facility] physical.  Mechele Claude, M.D.

## 2023-02-10 ENCOUNTER — Other Ambulatory Visit: Payer: Commercial Managed Care - PPO

## 2023-02-10 DIAGNOSIS — Z0000 Encounter for general adult medical examination without abnormal findings: Secondary | ICD-10-CM | POA: Diagnosis not present

## 2023-02-10 LAB — URINALYSIS
Bilirubin, UA: NEGATIVE
Glucose, UA: NEGATIVE
Ketones, UA: NEGATIVE
Leukocytes,UA: NEGATIVE
Nitrite, UA: NEGATIVE
Protein,UA: NEGATIVE
RBC, UA: NEGATIVE
Specific Gravity, UA: 1.01 (ref 1.005–1.030)
Urobilinogen, Ur: 0.2 mg/dL (ref 0.2–1.0)
pH, UA: 5.5 (ref 5.0–7.5)

## 2023-02-11 LAB — CBC WITH DIFFERENTIAL/PLATELET
Basophils Absolute: 0 10*3/uL (ref 0.0–0.2)
Basos: 1 %
EOS (ABSOLUTE): 0.1 10*3/uL (ref 0.0–0.4)
Eos: 2 %
Hematocrit: 44.9 % (ref 34.0–46.6)
Hemoglobin: 15.2 g/dL (ref 11.1–15.9)
Immature Grans (Abs): 0 10*3/uL (ref 0.0–0.1)
Immature Granulocytes: 0 %
Lymphocytes Absolute: 1.2 10*3/uL (ref 0.7–3.1)
Lymphs: 29 %
MCH: 30.6 pg (ref 26.6–33.0)
MCHC: 33.9 g/dL (ref 31.5–35.7)
MCV: 91 fL (ref 79–97)
Monocytes Absolute: 0.6 10*3/uL (ref 0.1–0.9)
Monocytes: 13 %
Neutrophils Absolute: 2.3 10*3/uL (ref 1.4–7.0)
Neutrophils: 55 %
Platelets: 281 10*3/uL (ref 150–450)
RBC: 4.96 x10E6/uL (ref 3.77–5.28)
RDW: 11.9 % (ref 11.7–15.4)
WBC: 4.2 10*3/uL (ref 3.4–10.8)

## 2023-02-11 LAB — CMP14+EGFR
ALT: 34 [IU]/L — ABNORMAL HIGH (ref 0–32)
AST: 21 [IU]/L (ref 0–40)
Albumin: 4.8 g/dL (ref 3.9–4.9)
Alkaline Phosphatase: 106 [IU]/L (ref 44–121)
BUN/Creatinine Ratio: 9 (ref 9–23)
BUN: 8 mg/dL (ref 6–24)
Bilirubin Total: 0.6 mg/dL (ref 0.0–1.2)
CO2: 20 mmol/L (ref 20–29)
Calcium: 9.1 mg/dL (ref 8.7–10.2)
Chloride: 102 mmol/L (ref 96–106)
Creatinine, Ser: 0.89 mg/dL (ref 0.57–1.00)
Globulin, Total: 2.6 g/dL (ref 1.5–4.5)
Glucose: 78 mg/dL (ref 70–99)
Potassium: 3.9 mmol/L (ref 3.5–5.2)
Sodium: 140 mmol/L (ref 134–144)
Total Protein: 7.4 g/dL (ref 6.0–8.5)
eGFR: 81 mL/min/{1.73_m2} (ref >59)

## 2023-02-11 LAB — LIPID PANEL
Chol/HDL Ratio: 4.8 {ratio} — ABNORMAL HIGH (ref 0.0–4.4)
Cholesterol, Total: 154 mg/dL (ref 100–199)
HDL: 32 mg/dL — ABNORMAL LOW (ref >39)
LDL Chol Calc (NIH): 72 mg/dL (ref 0–99)
Triglycerides: 308 mg/dL — ABNORMAL HIGH (ref 0–149)
VLDL Cholesterol Cal: 50 mg/dL — ABNORMAL HIGH (ref 5–40)

## 2023-02-23 ENCOUNTER — Encounter (INDEPENDENT_AMBULATORY_CARE_PROVIDER_SITE_OTHER): Payer: Self-pay

## 2023-02-23 ENCOUNTER — Ambulatory Visit (INDEPENDENT_AMBULATORY_CARE_PROVIDER_SITE_OTHER): Payer: No Typology Code available for payment source

## 2023-02-23 VITALS — BP 116/80 | HR 70 | Temp 98.0°F | Resp 17 | Ht 64.0 in | Wt 170.0 lb

## 2023-02-23 DIAGNOSIS — R399 Unspecified symptoms and signs involving the genitourinary system: Secondary | ICD-10-CM

## 2023-02-23 LAB — MCKESSON POCT UA 120
Bilirubin UA: NEGATIVE
Blood UA: NEGATIVE
Glucose UA: NEGATIVE
Ketone UA: NEGATIVE
Nitrite UA: NEGATIVE
Protein UA: NEGATIVE
Specific Gravity UA: 1.02
Urobilinogen UA: NEGATIVE
pH UA: 6

## 2023-02-23 MED ORDER — NITROFURANTOIN MONOHYD MACRO 100 MG PO CAPS
100.0000 mg | ORAL_CAPSULE | Freq: Two times a day (BID) | ORAL | 0 refills | Status: AC
Start: 2023-02-23 — End: 2023-02-28

## 2023-02-23 NOTE — Progress Notes (Unsigned)
Urgent Care Provider Note    Patient: Amanda Compton   Date: 02/23/2023   MRN: [id number]       Subjective     Chief Complaint   Patient presents with   . Urinary Tract Infection Symptoms     Pt is here c/o of pain and burning at urination started over the weekend       History of Present illness:  HPI    Amanda Compton is a 47 y.o. female who presents with moderate which started 2 to 3 days and has been gradually worsening. Patient admits to dysuria. Patient denies hematuria, pelvic pain, back pain, fever , chills, vomiting , vaginal discharge, and abnormal vaginal bleeding. LMP current.    No LMP recorded.    Pertinent Past Medical, Surgical, Family and Social History were reviewed.    Current Medications[1]    Allergies[2]    Medications and Allergies reviewed.         Objective     Vitals:    02/23/23 1532   BP: 116/80   Pulse: 70   Resp: 17   Temp: 98 F (36.7 C)   SpO2: 99%       Physical Exam    General: no acute distress, well developed, well nourished.    Heart: regular rate    Lungs: normal work of breathing    Abdomen: soft, normal bowel sounds, non tender, and no masses     Back: No CVA tenderness    UCC COURSE  LABS  The following POCT tests were ordered, reviewed and discussed with the patient/family.     Results       Procedure Component Value Units Date/Time    Culture, Urine [[id number]] Collected: 02/23/23 1621    Specimen: Urine, Clean Catch Updated: 02/23/23 1621    [employer] POCT UA [[id number]]  (Abnormal) Collected: 02/23/23 1548    Specimen: Clean Catch Updated: 02/23/23 1549     Color, UA Light Yellow     Clarity, UA Slightly Cloudy     Leukocytes UA 1+ (70 Leu/ul)     Nitrite UA Negative     Urobilinogen UA Negative (0.2 mg/dl)     Protein UA Negative     pH UA 6.0     Blood UA Negative     Specific Gravity UA 1.020     Ketone UA Negative     Bilirubin UA Negative     Glucose UA Negative            There were no x-rays reviewed with this patient during the visit.    Current Inpatient  Medications with Last Dose Taken[3]          PROCEDURES:  Procedures    MDM:   Clinical assessment consistent with acute UTI.   Differential diagnosis include but not limited to : Pyeloneprhitis, Kidney stones, overactive bladder , STI,.        Assessment         Berthine was seen today for urinary tract infection symptoms.    Diagnoses and all orders for this visit:    UTI symptoms  -     [employer] POCT UA; Future  -     [employer] POCT UA  -     nitrofurantoin, macrocrystal-monohydrate, (MACROBID) 100 MG capsule; Take 1 capsule (100 mg) by mouth 2 (two) times daily for 5 days  -     Culture, Urine; Future  -     Culture, Urine  Take medication as prescribed    Increase hydration  Cranberry juice   AZO as needed for pain   Will call with culture results   Follow up if no improvement      Plan and follow-up discussed with patient. See AVS for further documentation.           [1]   Current Outpatient Medications:   .  clobetasol (TEMOVATE) 0.05 % external solution, , Disp: , Rfl:   .  ketoconazole (NIZORAL) 2 % shampoo, , Disp: , Rfl:   .  metroNIDAZOLE (METROCREAM) 0.75 % cream, , Disp: , Rfl:   .  ondansetron (ZOFRAN) 4 MG tablet, , Disp: , Rfl:   .  Wegovy 1 MG/0.5ML injection, , Disp: , Rfl:   .  nitrofurantoin, macrocrystal-monohydrate, (MACROBID) 100 MG capsule, Take 1 capsule (100 mg) by mouth 2 (two) times daily for 5 days, Disp: 10 capsule, Rfl: 0  .  phentermine 15 MG capsule, Take 15 mg by mouth every morning., Disp: , Rfl:   [2] No Known Allergies  [3]   No current facility-administered medications for this visit.

## 2023-02-24 LAB — CULTURE, URINE

## 2023-02-25 ENCOUNTER — Telehealth (INDEPENDENT_AMBULATORY_CARE_PROVIDER_SITE_OTHER): Payer: Self-pay

## 2023-02-25 NOTE — Telephone Encounter (Signed)
-----   Message from [place] sent at 02/25/2023  8:53 AM EST -----  Please notify patient that their urine culture is positive. They should finish course of antibiotics

## 2023-02-26 ENCOUNTER — Other Ambulatory Visit: Payer: Self-pay

## 2023-03-01 ENCOUNTER — Encounter: Payer: Self-pay | Admitting: Gastroenterology

## 2023-03-02 ENCOUNTER — Telehealth: Payer: Commercial Managed Care - PPO | Admitting: Physician Assistant

## 2023-03-02 DIAGNOSIS — J208 Acute bronchitis due to other specified organisms: Secondary | ICD-10-CM | POA: Diagnosis not present

## 2023-03-02 DIAGNOSIS — B9689 Other specified bacterial agents as the cause of diseases classified elsewhere: Secondary | ICD-10-CM

## 2023-03-02 MED ORDER — BENZONATATE 100 MG PO CAPS: 100.0000 mg | ORAL_CAPSULE | Freq: Three times a day (TID) | ORAL | 0 refills | Status: DC | PRN

## 2023-03-02 MED ORDER — PREDNISONE 20 MG PO TABS: 40.0000 mg | ORAL_TABLET | Freq: Every day | ORAL | 0 refills | Status: DC

## 2023-03-02 MED ORDER — AZITHROMYCIN 250 MG PO TABS: ORAL_TABLET | ORAL | 0 refills | Status: AC

## 2023-03-02 NOTE — Progress Notes (Signed)
I have spent 5 minutes in review of e-visit questionnaire, review and updating patient chart, medical decision making and response to patient.   Mia Milan Cody Jacklynn Dehaas, PA-C    

## 2023-03-02 NOTE — Progress Notes (Signed)
E-Visit for Cough   We are sorry that you are not feeling well.  Here is how we plan to help!  Based on your presentation I believe you most likely have A cough due to bacteria.  When patients have a fever and a productive cough with a change in color or increased sputum production, we are concerned about bacterial bronchitis.  If left untreated it can progress to pneumonia.  If your symptoms do not improve with your treatment plan it is important that you contact your provider.   I have prescribed Azithromyin 250 mg: two tablets now and then one tablet daily for 4 additonal days    In addition you may use A prescription cough medication called Tessalon Perles 100mg . You may take 1-2 capsules every 8 hours as needed for your cough.  I have also sent in a 5-day burst of prednisone to take as directed. Continue use of your rescue inhaler.   From your responses in the eVisit questionnaire you describe inflammation in the upper respiratory tract which is causing a significant cough.  This is commonly called Bronchitis and has four common causes:   Allergies Viral Infections Acid Reflux Bacterial Infection Allergies, viruses and acid reflux are treated by controlling symptoms or eliminating the cause. An example might be a cough caused by taking certain blood pressure medications. You stop the cough by changing the medication. Another example might be a cough caused by acid reflux. Controlling the reflux helps control the cough.  USE OF BRONCHODILATOR ("RESCUE") INHALERS: There is a risk from using your bronchodilator too frequently.  The risk is that over-reliance on a medication which only relaxes the muscles surrounding the breathing tubes can reduce the effectiveness of medications prescribed to reduce swelling and congestion of the tubes themselves.  Although you feel brief relief from the bronchodilator inhaler, your asthma may actually be worsening with the tubes becoming more swollen and filled  with mucus.  This can delay other crucial treatments, such as oral steroid medications. If you need to use a bronchodilator inhaler daily, several times per day, you should discuss this with your provider.  There are probably better treatments that could be used to keep your asthma under control.     HOME CARE Only take medications as instructed by your medical team. Complete the entire course of an antibiotic. Drink plenty of fluids and get plenty of rest. Avoid close contacts especially the very young and the elderly Cover your mouth if you cough or cough into your sleeve. Always remember to wash your hands A steam or ultrasonic humidifier can help congestion.   GET HELP RIGHT AWAY IF: You develop worsening fever. You become short of breath You cough up blood. Your symptoms persist after you have completed your treatment plan MAKE SURE YOU  Understand these instructions. Will watch your condition. Will get help right away if you are not doing well or get worse.    Thank you for choosing an e-visit.  Your e-visit answers were reviewed by a board certified advanced clinical practitioner to complete your personal care plan. Depending upon the condition, your plan could have included both over the counter or prescription medications.  Please review your pharmacy choice. Make sure the pharmacy is open so you can pick up prescription now. If there is a problem, you may contact your provider through [employer] and have the prescription routed to another pharmacy.  Your safety is important to [place]. If you have drug allergies check your  prescription carefully.   For the next 24 hours you can use MyChart to ask questions about today's visit, request a non-urgent call back, or ask for a work or school excuse. You will get an email in the next two days asking about your experience. I hope that your e-visit has been valuable and will speed your recovery.

## 2023-03-04 ENCOUNTER — Other Ambulatory Visit: Payer: Self-pay

## 2023-03-18 ENCOUNTER — Other Ambulatory Visit: Payer: Self-pay

## 2023-03-18 ENCOUNTER — Other Ambulatory Visit: Payer: Self-pay | Admitting: Family Medicine

## 2023-03-18 ENCOUNTER — Other Ambulatory Visit (HOSPITAL_COMMUNITY): Payer: Self-pay

## 2023-03-20 ENCOUNTER — Other Ambulatory Visit (HOSPITAL_COMMUNITY): Payer: Self-pay

## 2023-03-20 MED ORDER — RIZATRIPTAN BENZOATE 5 MG PO TABS
10.0000 mg | ORAL_TABLET | ORAL | 1 refills | Status: AC | PRN
  Filled 2023-03-20: qty 10, 30d supply, fill #0

## 2023-04-05 ENCOUNTER — Ambulatory Visit: Payer: Commercial Managed Care - PPO | Admitting: *Deleted

## 2023-04-05 VITALS — Ht 61.0 in | Wt 183.0 lb

## 2023-04-05 DIAGNOSIS — Z1211 Encounter for screening for malignant neoplasm of colon: Secondary | ICD-10-CM

## 2023-04-05 MED ORDER — SUFLAVE 178.7 G PO SOLR: 1.0000 | ORAL | 0 refills | Status: DC

## 2023-04-05 NOTE — Progress Notes (Signed)
Pt's name and DOB verified at the beginning of the pre-visit wit 2 identifiers  Pt denies any difficulty with ambulating,sitting, laying down or rolling side to side  Pt has no issues with ambulation   Pt has no issues moving head neck or swallowing  No egg or soy allergy known to patient   No issues known to pt with past sedation with any surgeries or procedures  Pt denies having issues being intubated  No FH of Malignant Hyperthermia  Pt is not on diet pills or shots  Pt is not on home 02   Pt is not on blood thinners   Pt denies issues with constipation   Pt is not on dialysis  Pt denise any abnormal heart rhythms   Pt denies any upcoming cardiac testing  Pt encouraged to use to use Singlecare or Goodrx to reduce cost   Patient's chart reviewed by Cathlyn Parsons CNRA prior to pre-visit and patient appropriate for the [facility].  Pre-visit completed and red dot placed by patient's name on their procedure day (on provider's schedule).  .  Visit by phone  Pt states weight is 183 lb  Instructed pt why it is important to and  to call if they have any changes in health or new medications. Directed them to the # given and on instructions.     Instructions reviewed. Pt given both [facility] main # and MD on call # prior to instructions.  Pt states understanding. Instructed to review again prior to procedure. Pt states they will.   Instructions sent by mail and by My Chart

## 2023-04-08 ENCOUNTER — Other Ambulatory Visit: Payer: Self-pay

## 2023-04-15 ENCOUNTER — Encounter: Payer: Self-pay | Admitting: Gastroenterology

## 2023-04-23 ENCOUNTER — Encounter: Payer: Self-pay | Admitting: Gastroenterology

## 2023-04-23 ENCOUNTER — Ambulatory Visit (AMBULATORY_SURGERY_CENTER): Payer: Commercial Managed Care - PPO | Admitting: Gastroenterology

## 2023-04-23 VITALS — BP 110/76 | HR 72 | Temp 97.7°F | Resp 11 | Ht 61.0 in | Wt 183.0 lb

## 2023-04-23 DIAGNOSIS — D123 Benign neoplasm of transverse colon: Secondary | ICD-10-CM | POA: Diagnosis not present

## 2023-04-23 DIAGNOSIS — K648 Other hemorrhoids: Secondary | ICD-10-CM | POA: Diagnosis not present

## 2023-04-23 DIAGNOSIS — D122 Benign neoplasm of ascending colon: Secondary | ICD-10-CM | POA: Diagnosis not present

## 2023-04-23 DIAGNOSIS — K644 Residual hemorrhoidal skin tags: Secondary | ICD-10-CM

## 2023-04-23 DIAGNOSIS — K5730 Diverticulosis of large intestine without perforation or abscess without bleeding: Secondary | ICD-10-CM | POA: Diagnosis not present

## 2023-04-23 DIAGNOSIS — Z1211 Encounter for screening for malignant neoplasm of colon: Secondary | ICD-10-CM

## 2023-04-23 DIAGNOSIS — D125 Benign neoplasm of sigmoid colon: Secondary | ICD-10-CM | POA: Diagnosis not present

## 2023-04-23 MED ORDER — SODIUM CHLORIDE 0.9 % IV SOLN: 500.0000 mL | Freq: Once | INTRAVENOUS | Status: DC

## 2023-04-23 NOTE — Progress Notes (Signed)
 Pt's states no medical or surgical changes since previsit or office visit.

## 2023-04-23 NOTE — Patient Instructions (Signed)
   Handouts on polyps,diverticulosis,& hemorrhoids given to you today.   Await pathology results on polyps removed   Continue previous diet & medications  YOU HAD AN ENDOSCOPIC PROCEDURE TODAY AT THE [facility]:   Refer to the procedure report that was given to you for any specific questions about what was found during the examination.  If the procedure report does not answer your questions, please call your gastroenterologist to clarify.  If you requested that your care partner not be given the details of your procedure findings, then the procedure report has been included in a sealed envelope for you to review at your convenience later.  YOU SHOULD EXPECT: Some feelings of bloating in the abdomen. Passage of more gas than usual.  Walking can help get rid of the air that was put into your GI tract during the procedure and reduce the bloating. If you had a lower endoscopy (such as a colonoscopy or flexible sigmoidoscopy) you may notice spotting of blood in your stool or on the toilet paper. If you underwent a bowel prep for your procedure, you may not have a normal bowel movement for a few days.  Please Note:  You might notice some irritation and congestion in your nose or some drainage.  This is from the oxygen used during your procedure.  There is no need for concern and it should clear up in a day or so.  SYMPTOMS TO REPORT IMMEDIATELY:  Following lower endoscopy (colonoscopy or flexible sigmoidoscopy):  Excessive amounts of blood in the stool  Significant tenderness or worsening of abdominal pains  Swelling of the abdomen that is new, acute  Fever of 100F or higher    For urgent or emergent issues, a gastroenterologist can be reached at any hour by calling [phone]. Do not use MyChart messaging for urgent concerns.    DIET:  We do recommend a small meal at first, but then you may proceed to your regular diet.  Drink plenty of fluids but you should avoid alcoholic  beverages for 24 hours.  ACTIVITY:  You should plan to take it easy for the rest of today and you should NOT DRIVE or use heavy machinery until tomorrow (because of the sedation medicines used during the test).    FOLLOW UP: Our staff will call the number listed on your records the next business day following your procedure.  We will call around 7:15- 8:00 am to check on you and address any questions or concerns that you may have regarding the information given to you following your procedure. If we do not reach you, we will leave a message.     If any biopsies were taken you will be contacted by phone or by letter within the next 1-3 weeks.  Please call us at [phone] if you have not heard about the biopsies in 3 weeks.    SIGNATURES/CONFIDENTIALITY: You and/or your care partner have signed paperwork which will be entered into your electronic medical record.  These signatures attest to the fact that that the information above on your After Visit Summary has been reviewed and is understood.  Full responsibility of the confidentiality of this discharge information lies with you and/or your care-partner.

## 2023-04-23 NOTE — Progress Notes (Signed)
 [facility] History and Physical   Primary Care Physician:  Zollie Lowers, MD   Reason for Procedure:  Colorectal cancer screening  Plan:    Screening colonoscopy with possible interventions as needed     HPI: Sara Sellers is a very pleasant 48 y.o. female here for screening colonoscopy. Denies any nausea, vomiting, abdominal pain, melena or bright red blood per rectum  The risks and benefits as well as alternatives of endoscopic procedure(s) have been discussed and reviewed. All questions answered. The patient agrees to proceed.    Past Medical History:  Diagnosis Date   High triglycerides    Migraines    Umbilical hernia 01/2013    Past Surgical History:  Procedure Laterality Date   DILATION AND CURETTAGE OF UTERUS  01/09/2004   non-viable fetus   HERNIA REPAIR  01/23/2013   Umbilical Hernia   HYSTEROSCOPY WITH NOVASURE     UMBILICAL HERNIA REPAIR N/A 01/23/2013   Procedure: HERNIA REPAIR UMBILICAL ADULT;  Surgeon: Donnice Bury, MD;  Location: [facility];  Service: General;  Laterality: N/A;    Prior to Admission medications   Medication Sig Start Date End Date Taking? Authorizing Provider  albuterol  (VENTOLIN  HFA) 108 (90 Base) MCG/ACT inhaler Inhale 2 puffs into the lungs every 6 (six) hours as needed for wheezing or shortness of breath. 11/02/22   Zollie Lowers, MD  rizatriptan  (MAXALT ) 5 MG tablet Take 2 tablets (10 mg total) by mouth as needed for migraine. May repeat in 2 hours if needed 03/20/23   Zollie Lowers, MD  rosuvastatin  (CRESTOR ) 20 MG tablet Take 1 tablet (20 mg total) by mouth daily for cholesterol 02/03/23   Zollie Lowers, MD    Current Outpatient Medications  Medication Sig Dispense Refill   albuterol  (VENTOLIN  HFA) 108 (90 Base) MCG/ACT inhaler Inhale 2 puffs into the lungs every 6 (six) hours as needed for wheezing or shortness of breath. 6.7 g 5   rizatriptan  (MAXALT ) 5 MG tablet Take 2 tablets (10 mg total) by  mouth as needed for migraine. May repeat in 2 hours if needed 10 tablet 1   rosuvastatin  (CRESTOR ) 20 MG tablet Take 1 tablet (20 mg total) by mouth daily for cholesterol 90 tablet 1   Current Facility-Administered Medications  Medication Dose Route Frequency Provider Last Rate Last Admin   0.9 %  sodium chloride  infusion  500 mL Intravenous Once Aubery Douthat V, MD        Allergies as of 04/23/2023 - Review Complete 04/23/2023  Allergen Reaction Noted   Amoxicillin Hives 05/10/2011   Septra [bactrim] Hives 05/10/2011   Gemfibrozil Other (See Comments) 01/20/2017    Family History  Problem Relation Age of Onset   Colon polyps Mother    Diabetes Mother    Uterine cancer Mother    Hypertension Mother    Hypertension Father    Heart disease Father    Hyperlipidemia Brother    Hyperlipidemia Brother    Heart disease Maternal Aunt    Early death Maternal Aunt    Heart disease Maternal Uncle    Early death Maternal Uncle    Heart disease Paternal Aunt    Heart disease Paternal Uncle    Diabetes Maternal Grandmother    Hypothyroidism Maternal Grandmother    Diabetes Maternal Grandfather    Hypertension Maternal Grandfather    Heart disease Maternal Grandfather    Cancer Maternal Grandfather        lung w/ mets to bone   Stroke  Paternal Grandmother    Cancer Paternal Grandmother    Heart disease Paternal Grandfather    Breast cancer Neg Hx    Colon cancer Neg Hx    Esophageal cancer Neg Hx    Rectal cancer Neg Hx    Stomach cancer Neg Hx     Social History   Socioeconomic History   Marital status: Married    Spouse name: Not on file   Number of children: 3   Years of education: AS   Highest education level: Bachelor's degree (e.g., BA, AB, BS)  Occupational History    Employer: [employer]  Tobacco Use   Smoking status: Never   Smokeless tobacco: Never  Vaping Use   Vaping status: Never Used  Substance and Sexual Activity   Alcohol use: No    Alcohol/week:  0.0 standard drinks of alcohol   Drug use: No   Sexual activity: Yes    Birth control/protection: None, Surgical    Comment: ablation  Other Topics Concern   Not on file  Social History Narrative   Patient is married with 3 children.   Patient is right handed.   Patient has an Associates degree.   Patient drinks 2 cups daily.   Social Drivers of Corporate Investment Banker Strain: Low Risk  (11/02/2022)   Overall Financial Resource Strain (CARDIA)    Difficulty of Paying Living Expenses: Not hard at all  Food Insecurity: No Food Insecurity (11/02/2022)   Hunger Vital Sign    Worried About Running Out of Food in the Last Year: Never true    Ran Out of Food in the Last Year: Never true  Transportation Needs: No Transportation Needs (11/02/2022)   PRAPARE - Administrator, Civil Service (Medical): No    Lack of Transportation (Non-Medical): No  Physical Activity: Insufficiently Active (11/02/2022)   Exercise Vital Sign    Days of Exercise per Week: 1 day    Minutes of Exercise per Session: 20 min  Stress: No Stress Concern Present (11/02/2022)   [employer] of Occupational Health - Occupational Stress Questionnaire    Feeling of Stress : Only a little  Social Connections: Socially Integrated (11/02/2022)   Social Connection and Isolation Panel [NHANES]    Frequency of Communication with Friends and Family: More than three times a week    Frequency of Social Gatherings with Friends and Family: More than three times a week    Attends Religious Services: More than 4 times per year    Active Member of [employer] or Organizations: Yes    Attends Engineer, Structural: More than 4 times per year    Marital Status: Married  Catering Manager Violence: Not on file    Review of Systems:  All other review of systems negative except as mentioned in the HPI.  Physical Exam: Vital signs in last 24 hours: BP 127/74   Pulse 76   Temp 97.7 F (36.5 C) (Temporal)   Ht  5' 1 (1.549 m)   Wt 183 lb (83 kg)   SpO2 97%   BMI 34.58 kg/m  General:   Alert, NAD Lungs:  Clear .   Heart:  Regular rate and rhythm Abdomen:  Soft, nontender and nondistended. Neuro/Psych:  Alert and cooperative. Normal mood and affect. A and O x 3  Reviewed labs, radiology imaging, old records and pertinent past GI work up  Patient is appropriate for planned procedure(s) and anesthesia in an ambulatory setting   K.  Veena Keagon Glascoe , MD [phone]

## 2023-04-23 NOTE — Progress Notes (Signed)
 Called to room to assist during endoscopic procedure.  Patient ID and intended procedure confirmed with present staff. Received instructions for my participation in the procedure from the performing physician.

## 2023-04-23 NOTE — Op Note (Signed)
 [facility] Patient Name: Sara Sellers Procedure Date: 04/23/2023 1:12 PM MRN: [id number] Endoscopist: Gustav ALONSO Mcgee , MD, [id number] Age: 48 Referring MD:  Date of Birth: 08-19-1975 Gender: Female Account #: [id number] Procedure:                Colonoscopy Indications:              Screening for colorectal malignant neoplasm Medicines:                Monitored Anesthesia Care Procedure:                Pre-Anesthesia Assessment:                           - Prior to the procedure, a History and Physical                            was performed, and patient medications and                            allergies were reviewed. The patient's tolerance of                            previous anesthesia was also reviewed. The risks                            and benefits of the procedure and the sedation                            options and risks were discussed with the patient.                            All questions were answered, and informed consent                            was obtained. Prior Anticoagulants: The patient has                            taken no anticoagulant or antiplatelet agents. ASA                            Grade Assessment: II - A patient with mild systemic                            disease. After reviewing the risks and benefits,                            the patient was deemed in satisfactory condition to                            undergo the procedure.                           After obtaining informed consent, the colonoscope  was passed under direct vision. Throughout the                            procedure, the patient's blood pressure, pulse, and                            oxygen saturations were monitored continuously. The                            Olympus Scope [id number] was introduced through the                            anus and advanced to the the cecum, identified by                            appendiceal  orifice and ileocecal valve. The                            colonoscopy was performed without difficulty. The                            patient tolerated the procedure well. The quality                            of the bowel preparation was good. The ileocecal                            valve, appendiceal orifice, and rectum were                            photographed. Scope In: 1:18:51 PM Scope Out: 1:46:54 PM Scope Withdrawal Time: 0 hours 22 minutes 41 seconds  Total Procedure Duration: 0 hours 28 minutes 3 seconds  Findings:                 The perianal and digital rectal examinations were                            normal.                           Three sessile polyps were found in the sigmoid                            colon, transverse colon and ascending colon. The                            polyps were 3 to 7 mm in size. These polyps were                            removed with a cold snare. Resection and retrieval                            were complete.  A few small-mouthed diverticula were found in the                            sigmoid colon and descending colon.                           Non-bleeding external and internal hemorrhoids were                            found during retroflexion. The hemorrhoids were                            medium-sized. Complications:            No immediate complications. Estimated Blood Loss:     Estimated blood loss was minimal. Impression:               - Three 3 to 7 mm polyps in the sigmoid colon, in                            the transverse colon and in the ascending colon,                            removed with a cold snare. Resected and retrieved.                           - Diverticulosis in the sigmoid colon and in the                            descending colon.                           - Non-bleeding external and internal hemorrhoids. Recommendation:           - Patient has a contact number  available for                            emergencies. The signs and symptoms of potential                            delayed complications were discussed with the                            patient. Return to normal activities tomorrow.                            Written discharge instructions were provided to the                            patient.                           - Resume previous diet.                           - Continue present medications.                           -  Await pathology results.                           - Repeat colonoscopy in 3 - 5 years for                            surveillance based on pathology results. Mounir Skipper V. Russell Quinney, MD 04/23/2023 1:52:29 PM This report has been signed electronically.

## 2023-04-23 NOTE — Progress Notes (Signed)
 Sedate, gd SR, tolerated procedure well, VSS, report to RN

## 2023-04-26 ENCOUNTER — Telehealth: Payer: Self-pay

## 2023-04-26 NOTE — Telephone Encounter (Signed)
  Follow up Call-     04/23/2023   12:55 PM  Call back number  Post procedure Call Back phone  # [phone]  Permission to leave phone message Yes     Patient questions:  Do you have a fever, pain , or abdominal swelling? No. Pain Score  0 *  Have you tolerated food without any problems? Yes.    Have you been able to return to your normal activities? Yes.    Do you have any questions about your discharge instructions: Diet   No. Medications  No. Follow up visit  No.  Do you have questions or concerns about your Care? No.  Actions: * If pain score is 4 or above: No action needed, pain <4.

## 2023-04-28 LAB — SURGICAL PATHOLOGY

## 2023-06-17 ENCOUNTER — Encounter: Payer: Self-pay | Admitting: Gastroenterology

## 2023-07-23 ENCOUNTER — Other Ambulatory Visit: Payer: Self-pay

## 2023-09-30 ENCOUNTER — Other Ambulatory Visit: Payer: Self-pay | Admitting: Family Medicine

## 2023-10-01 ENCOUNTER — Other Ambulatory Visit (HOSPITAL_COMMUNITY): Payer: Self-pay

## 2023-10-01 MED ORDER — ROSUVASTATIN CALCIUM 20 MG PO TABS
20.0000 mg | ORAL_TABLET | Freq: Every day | ORAL | 1 refills | Status: DC
  Filled 2023-10-01 – 2023-10-18 (×3): qty 90, 90d supply, fill #0
  Filled 2024-01-12: qty 90, 90d supply, fill #1

## 2023-10-02 ENCOUNTER — Other Ambulatory Visit (HOSPITAL_COMMUNITY): Payer: Self-pay

## 2023-10-04 ENCOUNTER — Other Ambulatory Visit: Payer: Self-pay

## 2023-10-04 ENCOUNTER — Other Ambulatory Visit (HOSPITAL_COMMUNITY): Payer: Self-pay

## 2023-10-04 ENCOUNTER — Ambulatory Visit: Admitting: Family Medicine

## 2023-10-04 ENCOUNTER — Encounter: Payer: Self-pay | Admitting: Family Medicine

## 2023-10-04 VITALS — BP 125/74 | HR 73 | Temp 97.9°F | Ht 61.0 in | Wt 182.0 lb

## 2023-10-04 DIAGNOSIS — M7551 Bursitis of right shoulder: Secondary | ICD-10-CM | POA: Diagnosis not present

## 2023-10-04 MED ORDER — DICLOFENAC SODIUM 75 MG PO TBEC: 75.0000 mg | DELAYED_RELEASE_TABLET | Freq: Two times a day (BID) | ORAL | 1 refills | Status: DC

## 2023-10-04 MED ORDER — BETAMETHASONE SOD PHOS & ACET 6 (3-3) MG/ML IJ SUSP
6.0000 mg | Freq: Once | INTRAMUSCULAR | Status: AC
Start: 2023-10-04 — End: 2023-10-04
  Administered 2023-10-04: 6 mg via INTRAMUSCULAR

## 2023-10-04 NOTE — Progress Notes (Signed)
 Chief Complaint  Patient presents with   Shoulder Pain    Right shoulder pain for 1 week. Biofreeze, heat and ice not helping.  No injury. Right in the front of shoulder. Tender to the touch. Sometimes aches and burns but pain is worse when moved.     HPI  Patient presents today for right shoulder pain onset a week ago however there is no injury.  Aching and burning noted but primarily it occurs with motion.  PMH: Smoking status noted Review of Systems  Objective: BP 125/74   Pulse 73   Temp 97.9 F (36.6 C)   Ht 5' 1 (1.549 m)   Wt 182 lb (82.6 kg)   SpO2 96%   BMI 34.39 kg/m  Gen: NAD, alert, cooperative with exam HEENT: NCAT, EOMI, PERRL CV: RRR, good S1/S2, no murmur Resp: CTABL, no wheezes, non-labored Abd: SNTND, BS present, no guarding or organomegaly Ext: No edema, warm.  There is some point tenderness over the [facility] area with mild swelling.  Tenderness in the subacromial area.  There is some pain with abduction over 90 degrees of the right shoulder as well Neuro: Alert and oriented, No gross deficits  Subacromial bursitis of right shoulder joint -     Betamethasone  Sod Phos & Acet  Other orders -     Diclofenac  Sodium; Take 1 tablet (75 mg total) by mouth 2 (two) times daily. For muscle and  Joint pain  Dispense: 60 tablet; Refill: 1  Follow-up as needed

## 2023-10-19 ENCOUNTER — Other Ambulatory Visit (HOSPITAL_COMMUNITY): Payer: Self-pay

## 2023-10-19 ENCOUNTER — Other Ambulatory Visit: Payer: Self-pay

## 2023-11-01 ENCOUNTER — Other Ambulatory Visit: Payer: Self-pay

## 2023-11-02 DIAGNOSIS — M25511 Pain in right shoulder: Secondary | ICD-10-CM | POA: Diagnosis not present

## 2023-11-08 ENCOUNTER — Other Ambulatory Visit (INDEPENDENT_AMBULATORY_CARE_PROVIDER_SITE_OTHER): Payer: Self-pay | Admitting: Nurse Practitioner

## 2023-12-14 DIAGNOSIS — M25511 Pain in right shoulder: Secondary | ICD-10-CM | POA: Diagnosis not present

## 2023-12-16 ENCOUNTER — Other Ambulatory Visit (HOSPITAL_COMMUNITY): Payer: Self-pay | Admitting: Physician Assistant

## 2023-12-16 ENCOUNTER — Other Ambulatory Visit (INDEPENDENT_AMBULATORY_CARE_PROVIDER_SITE_OTHER): Payer: Self-pay | Admitting: Nurse Practitioner

## 2023-12-16 DIAGNOSIS — M25511 Pain in right shoulder: Secondary | ICD-10-CM

## 2023-12-17 ENCOUNTER — Ambulatory Visit (HOSPITAL_COMMUNITY)
Admission: RE | Admit: 2023-12-17 | Discharge: 2023-12-17 | Disposition: A | Discharge status: Home / Self Care | Source: Ambulatory Visit | Attending: Physician Assistant | Admitting: Physician Assistant

## 2023-12-17 DIAGNOSIS — M19019 Primary osteoarthritis, unspecified shoulder: Secondary | ICD-10-CM | POA: Diagnosis not present

## 2023-12-17 DIAGNOSIS — M25511 Pain in right shoulder: Secondary | ICD-10-CM | POA: Insufficient documentation

## 2023-12-17 DIAGNOSIS — M67814 Other specified disorders of tendon, left shoulder: Secondary | ICD-10-CM | POA: Diagnosis not present

## 2023-12-29 ENCOUNTER — Other Ambulatory Visit (INDEPENDENT_AMBULATORY_CARE_PROVIDER_SITE_OTHER): Payer: Self-pay | Admitting: Nurse Practitioner

## 2024-01-06 DIAGNOSIS — M25511 Pain in right shoulder: Secondary | ICD-10-CM | POA: Diagnosis not present

## 2024-01-06 DIAGNOSIS — M19011 Primary osteoarthritis, right shoulder: Secondary | ICD-10-CM | POA: Diagnosis not present

## 2024-01-07 DIAGNOSIS — M19011 Primary osteoarthritis, right shoulder: Secondary | ICD-10-CM | POA: Insufficient documentation

## 2024-01-13 ENCOUNTER — Other Ambulatory Visit: Payer: Self-pay

## 2024-01-19 ENCOUNTER — Telehealth (INDEPENDENT_AMBULATORY_CARE_PROVIDER_SITE_OTHER): Payer: Self-pay

## 2024-01-19 NOTE — Telephone Encounter (Signed)
 Reached out to patient to move their appt up to see Dr. Leanne to discuss surgery and patient decided to take a potential date of 12/30

## 2024-01-20 ENCOUNTER — Ambulatory Visit (INDEPENDENT_AMBULATORY_CARE_PROVIDER_SITE_OTHER): Admitting: Foot & Ankle Surgery

## 2024-01-20 ENCOUNTER — Ambulatory Visit (INDEPENDENT_AMBULATORY_CARE_PROVIDER_SITE_OTHER)

## 2024-01-20 ENCOUNTER — Encounter (INDEPENDENT_AMBULATORY_CARE_PROVIDER_SITE_OTHER): Payer: Self-pay | Admitting: Foot & Ankle Surgery

## 2024-01-20 VITALS — Ht 64.0 in | Wt 170.0 lb

## 2024-01-20 DIAGNOSIS — M79671 Pain in right foot: Secondary | ICD-10-CM

## 2024-01-20 NOTE — Patient Instructions (Signed)
 ==================================================================                   Dr. Leanne Scull Op MIS Bunion Protocol      0-2 weeks: Your toes will be bandaged or strapped in a particular dressing that was placed during surgery. This dressing is to stay on until your first post-operative visit. Please keep this dressing dry.   We recommend using a cast bag for this (you may purchase on [employer]).     You will be allowed to put as much weight on the surgical foot as you feel comfortable as long is  the pain is a 1,2, or 3/10 on the pain scale. This is what we call "weightbearing as tolerated."     You may weight bear after the block wears off and you can feel your foot again. Until then, you  should not put weight on it.      Crutches are not mandatory; however you may use them (or other assistive devices such as a rolling knee scooter or iWalk) if you feel more comfortable. Crutches will be given to you at the surgery center/hospital.      You will be given an Aircast boot prior to surgery (walking boot) Please bring it with you to the surgery so that it may be applied.   Any time you are up moving around you need to have the boot on. You do not need to wear the boot to sleep.     2-4 weeks: The initial post-operative dressing will be removed. If the wounds look good, we  will remove sutures. We will re-strap the bunion (and or lesser toes depending on your  procedure) with a new dressing that is applied in clinic.       You may be given a Darco toe alignment splint based on your procedure.      You will still be able to put as much weight as you feel comfortable on the foot (weightbearing as tolerated) and will still have to use a boot for walking.     We often begin a course of physical therapy at this time to work on range of motion, swelling, and strengthening. This is an exercise program we will provide. You will also be encouraged to perform scar tissue massage. Instructions will be provided.      You  will also be given a surgical shoe that you can use around your home or for short distances. You should never be bare foot.      4-6 weeks:   4 weeks from date of surgery is the earliest you can try and wean from the boot into a supportive sneaker. We find that often times this is challenging due to swelling, and you may require the walking boot for additional time depending on your swelling.      6-12 weeks: You are working on getting back into a regular supportive sneaker, still using the  Silicone toe separator. You should be working with your exercises and may consider formal PT. You may start to resume low impact activities as pain allows. Low impact activities include walking, biking, swimming (no pushing off the pool wall to turn), and modified yoga (no positions such as downward dog where you are forcing the toe upward).  12 weeks and beyond: You should be fully walking in regular shoes. You can discontinue use of the silicone splint.. You may start to incorporate higher impact activities as pain allows  (running, jumping, activities requiring  pushing off the toes).     Things to consider: Minimally invasive surgery does not mean "no pain." Post-operative pain  is experienced differently by every patient.      We find that by taking a minimally invasive  approach, patients typically do not experience a significant amount of post-operative pain. Most  of our patients do not require narcotic pain medication after surgery.      However, if they are  needed, most patients only require them for the first day or two after surgery or as the block is  wearing off.      You will have swelling after surgery. Swelling can persist for quite some time, and  it is not uncommon to still experience swelling at 2-3 months post-op. In general, we tell  patients to think of the first 3 months as the "acute" recovery phase, where you will have dressings/CAM boot/physical therapy/etc. Most people feel like they are getting back  to  "normal" and activities they normally participate in around 3 months and beyond     ==================================================================  Did you know [facility] has a YouTube page?     You can find a great deal of wonderful information on the [facility] home page on YouTube.   Search for them [email or web address]     Check out Dr. Leanne  on the [facility] channel as she speaks on minimally invasive bunion surgery as well as total ankle replacement!     Copy and paste this link to go directly to her talk!     [email or web address]                 Let us  know about your visit!    Give us  your feedback with ease!        You can leave a review for Dr. Leanne on [employer] at your convenience by using the following QR code, or Google Link:     Dr. Blanchard L. Leanne, DPM, D.ABFAS, FACFAS would love your feedback. Post a review to our profile.    [email or web address]

## 2024-01-23 ENCOUNTER — Encounter (INDEPENDENT_AMBULATORY_CARE_PROVIDER_SITE_OTHER): Payer: Self-pay | Admitting: Foot & Ankle Surgery

## 2024-01-23 NOTE — Progress Notes (Signed)
 Podiatric Surgery Patient Visit    Patient Name: Amanda Compton, Amanda Compton    Assessment and Plan:     Assessment & Plan  Right foot hallux valgus (bunion) with associated callus formation    Chronic hallux valgus with a 21-degree angle between the first and second metatarsals has led to persistent discomfort and callus formation. Conservative measures have provided temporary relief, but symptoms recur once treatment is stopped. Surgical intervention is planned, involving a small incision, bone rotation, and potential bone shaving. Recovery includes immediate weight-bearing with a boot for four weeks, transitioning to a sneaker, and resuming high-impact activities after three months. 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 Scheduled surgical correction of the hallux valgus is planned, with a boot provided for post-operative use. A pre-operative appointment will be arranged to discuss surgical details.    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 Although not significantly painful, it is bothersome. Surgical correction will involve tendon lengthening and possibly a bone cut to straighten the toe. She is interested in correcting the deformity for improved appearance and comfort. Surgical correction of the curly toe deformity will be scheduled concurrently with the hallux valgus surgery, and a surgical shoe will be provided for post-operative use.        -Patient instructed to call with any questions, concerns, or worsening of symptoms    Subjective:     Chief Complaint:   Chief Complaint   Patient presents with    Foot Pain     Right foot bunion       HPI:   History of Present Illness  Amanda Compton is a 48 year old female who presents with bunion-related discomfort and callus formation.    Right foot bunion discomfort  - Intermittent discomfort  due to a bunion on the right foot for over one year  - Pain worsens with wearing tennis shoes, leading to redness and irritation  - Conservative measures including wider shoes and flip flops have not provided lasting relief  - Concern regarding appearance and function of the bunion    Interdigital callus formation  - Hard callus developed between the right big toe and second toe, attributed to rubbing  - Callus persists despite topical treatments  - Symptoms worsen with closed shoes and improve slightly when avoiding them, but recur with resumed use    Fourth toe curvature (curly toe)  - Curly toe present on the right fourth toe, causing the toe to curl under  - Discomfort occurs when standing for long periods  - Belief that the condition may be genetic, as her father has a similar toe deformity  - Concern regarding appearance and function of the curly toe    Impact of occupational standing  - Employment in pharmacy requires prolonged standing, exacerbating foot discomfort      DAX Copilot Consent:   The patient and/or family gave verbal consent today for  use of DAX Copilot with electronic transmission using iPhone for the purposes of capturing audio into the patient's chart/designated cloud.     Denies N/V/F/C/SOB/CP    All other systems were reviewed and are negative    The following portions of the patient's history were reviewed and updated as appropriate: allergies, current medications, past medical history, past surgical history, family history, and problem list.    Physical Exam:  MSK:     Physical Exam  MUSCULOSKELETAL: Foot joint normal, no arthritis. Foot motion normal, able to reduce well. Second toe pushed up. Tailor's bunion, wide forefoot. Curly toe, genetic. Bunion present. First to second metatarsal angle 21 degrees.      -Compartments are supple and compressible without gross deformity.   -Negative anterior drawer, negative talar tilt  -No calf pain noted    Vascular:  Palpable pedal pulses  b/l, CFT to all toes <3 sec. No edema. No varicosities    Derm:  Skin intact without wounds or rashes.  No erythema or ecchymosis.    Neuro:  SILT. Neg tinel's sign.  Able to wiggle toes. No motor deficit        Radiology:    Results    Stereo Guid Bx Breast W or Wo Clip  Lt  Result Date: 01/08/2024   Stereotactic/tomosynthesis guided core biopsy of the mass in the left breast, upper outer quadrant. Postbiopsy mammogram: Craniocaudal and mediolateral views of the left breast following biopsy demonstrate the biopsy clip in the expected position. PARENCHYMAL PATTERN:  There are scattered areas of fibroglandular density. Clip summary: Stereo bx 12/29/2023 Open coil upper outer quadrant mass Electronically signed by: Benard RAMAN. Sujlana M.D. [facility] PSS: 12/29/23    Mammo Post Clip Placement Left Stereo  Result Date: 01/08/2024   Stereotactic/tomosynthesis guided core biopsy of the mass in the left breast, upper outer quadrant. Postbiopsy mammogram: Craniocaudal and mediolateral views of the left breast following biopsy demonstrate the biopsy clip in the expected position. PARENCHYMAL PATTERN:  There are scattered areas of fibroglandular density. Clip summary: Stereo bx 12/29/2023 Open coil upper outer quadrant mass Electronically signed by: Benard RAMAN. Sujlana M.D. [facility] PSS: 12/29/23                     Kyree Adriano L. Leanne MAUL, FACFAS  [facility] Podiatric Surgery

## 2024-01-24 ENCOUNTER — Other Ambulatory Visit: Payer: Self-pay | Admitting: Female Pelvic Medicine and Reconstructive Surgery

## 2024-01-26 ENCOUNTER — Telehealth (INDEPENDENT_AMBULATORY_CARE_PROVIDER_SITE_OTHER): Payer: Self-pay

## 2024-01-26 NOTE — Telephone Encounter (Signed)
 Called patient back and confirmed surgery day of 12/30 also scheduled pre-op and post-op while on phone

## 2024-01-26 NOTE — Telephone Encounter (Signed)
 Patient called that she has been waiting for a call back from our team, wants to schedule surgery because she will be traveling to where there will be no network.  I informed patient that I will send a message to the surgical scheduler and she will get back to her, voiced understanding. Message sent to the provider.

## 2024-01-28 ENCOUNTER — Encounter (INDEPENDENT_AMBULATORY_CARE_PROVIDER_SITE_OTHER): Payer: Self-pay | Admitting: Foot & Ankle Surgery

## 2024-02-01 ENCOUNTER — Telehealth (INDEPENDENT_AMBULATORY_CARE_PROVIDER_SITE_OTHER): Payer: Self-pay

## 2024-02-01 NOTE — Telephone Encounter (Signed)
 LVM with patient regarding work forms.

## 2024-02-02 ENCOUNTER — Encounter (INDEPENDENT_AMBULATORY_CARE_PROVIDER_SITE_OTHER): Payer: Self-pay

## 2024-02-03 ENCOUNTER — Other Ambulatory Visit (HOSPITAL_COMMUNITY): Payer: Self-pay

## 2024-02-03 ENCOUNTER — Telehealth: Admitting: Physician Assistant

## 2024-02-03 ENCOUNTER — Encounter: Payer: Commercial Managed Care - PPO | Admitting: Family Medicine

## 2024-02-03 DIAGNOSIS — H1031 Unspecified acute conjunctivitis, right eye: Secondary | ICD-10-CM

## 2024-02-03 MED ORDER — OFLOXACIN 0.3 % OP SOLN
1.0000 [drp] | Freq: Four times a day (QID) | OPHTHALMIC | 0 refills | Status: DC
  Filled 2024-02-03: qty 5, 25d supply, fill #0

## 2024-02-03 NOTE — Progress Notes (Signed)
 We are sorry that you are not feeling well.  Here is how we plan to help!  Based on what you have shared with me it looks like you have conjunctivitis.  Conjunctivitis is a common inflammatory or infectious condition of the eye that is often referred to as pink eye.  In most cases it is contagious (viral or bacterial). However, not all conjunctivitis requires antibiotics (ex. Allergic).  We have made appropriate suggestions for you based upon your presentation.  I have prescribed Oflaxacin 1-2 drops 4 times a day times 5 days   Pink eye can be highly contagious.  It is typically spread through direct contact with secretions, or contaminated objects or surfaces that one may have touched.  Strict handwashing is suggested with soap and water is urged.  If not available, use alcohol based had sanitizer.  Avoid unnecessary touching of the eye.  If you wear contact lenses, you will need to refrain from wearing them until you see no white discharge from the eye for at least 24 hours after being on medication.  You should see symptom improvement in 1-2 days after starting the medication regimen.  Call us  if symptoms are not improved in 1-2 days.  Home Care: Wash your hands often! Do not wear your contacts until you complete your treatment plan. Avoid sharing towels, bed linen, personal items with a person who has pink eye. See attention for anyone in your home with similar symptoms.  Get Help Right Away If: Your symptoms do not improve. You develop blurred or loss of vision. Your symptoms worsen (increased discharge, pain or redness)  Your e-visit answers were reviewed by a board certified advanced clinical practitioner to complete your personal care plan.  Depending on the condition, your plan could have included both over the counter or prescription medications.  If there is a problem please reply  once you have received a response from your provider.  Your safety is important to us .  If you have  drug allergies check your prescription carefully.    You can use MyChart to ask questions about today's visit, request a non-urgent call back, or ask for a work or school excuse for 24 hours related to this e-Visit. If it has been greater than 24 hours you will need to follow up with your provider, or enter a new e-Visit to address those concerns.   You will get an e-mail in the next two days asking about your experience.  I hope that your e-visit has been valuable and will speed your recovery. Thank you for using e-visits.  I have spent 5 minutes in review of e-visit questionnaire, review and updating patient chart, medical decision making and response to patient.   Elsie Velma Lunger, PA-C

## 2024-02-11 ENCOUNTER — Encounter (INDEPENDENT_AMBULATORY_CARE_PROVIDER_SITE_OTHER): Payer: Self-pay

## 2024-02-11 NOTE — Progress Notes (Signed)
 ANDREZ [phone]

## 2024-02-14 ENCOUNTER — Ambulatory Visit: Admitting: Sports Medicine

## 2024-02-14 ENCOUNTER — Other Ambulatory Visit (HOSPITAL_COMMUNITY): Payer: Self-pay

## 2024-02-14 ENCOUNTER — Ambulatory Visit (INDEPENDENT_AMBULATORY_CARE_PROVIDER_SITE_OTHER): Admitting: Foot & Ankle Surgery

## 2024-02-14 VITALS — BP 128/89 | HR 85 | Temp 98.3°F | Ht 61.0 in | Wt 191.0 lb

## 2024-02-14 DIAGNOSIS — Z1329 Encounter for screening for other suspected endocrine disorder: Secondary | ICD-10-CM

## 2024-02-14 DIAGNOSIS — Z1231 Encounter for screening mammogram for malignant neoplasm of breast: Secondary | ICD-10-CM

## 2024-02-14 DIAGNOSIS — Z124 Encounter for screening for malignant neoplasm of cervix: Secondary | ICD-10-CM

## 2024-02-14 DIAGNOSIS — Z130 Encounter for screening for diseases of the blood and blood-forming organs and certain disorders involving the immune mechanism: Secondary | ICD-10-CM | POA: Diagnosis not present

## 2024-02-14 DIAGNOSIS — Z131 Encounter for screening for diabetes mellitus: Secondary | ICD-10-CM | POA: Diagnosis not present

## 2024-02-14 DIAGNOSIS — Z113 Encounter for screening for infections with a predominantly sexual mode of transmission: Secondary | ICD-10-CM

## 2024-02-14 DIAGNOSIS — E782 Mixed hyperlipidemia: Secondary | ICD-10-CM

## 2024-02-14 DIAGNOSIS — Z0000 Encounter for general adult medical examination without abnormal findings: Secondary | ICD-10-CM | POA: Diagnosis not present

## 2024-02-14 MED ORDER — ROSUVASTATIN CALCIUM 20 MG PO TABS
20.0000 mg | ORAL_TABLET | Freq: Every day | ORAL | 1 refills | Status: DC
  Filled 2024-02-14: qty 90, 90d supply, fill #0

## 2024-02-14 NOTE — Patient Instructions (Signed)
 Health Maintenance, Female Adopting a healthy lifestyle and getting preventive care are important in promoting health and wellness. Ask your health care provider about: The right schedule for you to have regular tests and exams. Things you can do on your own to prevent diseases and keep yourself healthy. What should I know about diet, weight, and exercise? Eat a healthy diet  Eat a diet that includes plenty of vegetables, fruits, low-fat dairy products, and lean protein. Do not eat a lot of foods that are high in solid fats, added sugars, or sodium. Maintain a healthy weight Body mass index (BMI) is used to identify weight problems. It estimates body fat based on height and weight. Your health care provider can help determine your BMI and help you achieve or maintain a healthy weight. Get regular exercise Get regular exercise. This is one of the most important things you can do for your health. Most adults should: Exercise for at least 150 minutes each week. The exercise should increase your heart rate and make you sweat (moderate-intensity exercise). Do strengthening exercises at least twice a week. This is in addition to the moderate-intensity exercise. Spend less time sitting. Even light physical activity can be beneficial. Watch cholesterol and blood lipids Have your blood tested for lipids and cholesterol at 47 years of age, then have this test every 5 years. Have your cholesterol levels checked more often if: Your lipid or cholesterol levels are high. You are older than 47 years of age. You are at high risk for heart disease. What should I know about cancer screening? Depending on your health history and family history, you may need to have cancer screening at various ages. This may include screening for: Breast cancer. Cervical cancer. Colorectal cancer. Skin cancer. Lung cancer. What should I know about heart disease, diabetes, and high blood pressure? Blood pressure and heart  disease High blood pressure causes heart disease and increases the risk of stroke. This is more likely to develop in people who have high blood pressure readings or are overweight. Have your blood pressure checked: Every 3-5 years if you are 58-64 years of age. Every year if you are 64 years old or older. Diabetes Have regular diabetes screenings. This checks your fasting blood sugar level. Have the screening done: Once every three years after age 71 if you are at a normal weight and have a low risk for diabetes. More often and at a younger age if you are overweight or have a high risk for diabetes. What should I know about preventing infection? Hepatitis B If you have a higher risk for hepatitis B, you should be screened for this virus. Talk with your health care provider to find out if you are at risk for hepatitis B infection. Hepatitis C Testing is recommended for: Everyone born from [birth year] through 1964. Anyone with known risk factors for hepatitis C. Sexually transmitted infections (STIs) Get screened for STIs, including gonorrhea and chlamydia, if: You are sexually active and are younger than 47 years of age. You are older than 47 years of age and your health care provider tells you that you are at risk for this type of infection. Your sexual activity has changed since you were last screened, and you are at increased risk for chlamydia or gonorrhea. Ask your health care provider if you are at risk. Ask your health care provider about whether you are at high risk for HIV. Your health care provider may recommend a prescription medicine to help prevent HIV  infection. If you choose to take medicine to prevent HIV, you should first get tested for HIV. You should then be tested every 3 months for as long as you are taking the medicine. Pregnancy If you are about to stop having your period (premenopausal) and you may become pregnant, seek counseling before you get pregnant. Take 400 to 800  micrograms (mcg) of folic acid every day if you become pregnant. Ask for birth control (contraception) if you want to prevent pregnancy. Osteoporosis and menopause Osteoporosis is a disease in which the bones lose minerals and strength with aging. This can result in bone fractures. If you are 8 years old or older, or if you are at risk for osteoporosis and fractures, ask your health care provider if you should: Be screened for bone loss. Take a calcium  or vitamin D supplement to lower your risk of fractures. Be given hormone replacement therapy (HRT) to treat symptoms of menopause. Follow these instructions at home: Alcohol use Do not drink alcohol if: Your health care provider tells you not to drink. You are pregnant, may be pregnant, or are planning to become pregnant. If you drink alcohol: Limit how much you have to: 0-1 drink a day. Know how much alcohol is in your drink. In the [place] one drink equals one 12 oz bottle of beer (355 mL), one 5 oz glass of wine (148 mL), or one 1 oz glass of hard liquor (44 mL). Lifestyle Do not use any products that contain nicotine or tobacco. These products include cigarettes, chewing tobacco, and vaping devices, such as e-cigarettes. If you need help quitting, ask your health care provider. Do not use street drugs. Do not share needles. Ask your health care provider for help if you need support or information about quitting drugs. General instructions Schedule regular health, dental, and eye exams. Stay current with your vaccines. Tell your health care provider if: You often feel depressed. You have ever been abused or do not feel safe at home. Summary Adopting a healthy lifestyle and getting preventive care are important in promoting health and wellness. Follow your health care provider's instructions about healthy diet, exercising, and getting tested or screened for diseases. Follow your health care provider's instructions on monitoring your  cholesterol and blood pressure. This information is not intended to replace advice given to you by your health care provider. Make sure you discuss any questions you have with your health care provider. Document Revised: 07/22/2020 Document Reviewed: 07/22/2020 Elsevier Patient Education  2024 [employer]    Why follow it? Research shows. Those who follow the Mediterranean diet have a reduced risk of heart disease  The diet is associated with a reduced incidence of Parkinson's and Alzheimer's diseases People following the diet may have longer life expectancies and lower rates of chronic diseases  The Dietary Guidelines for Americans recommends the Mediterranean diet as an eating plan to promote health and prevent disease  What Is the Mediterranean Diet?  Healthy eating plan based on typical foods and recipes of Mediterranean-style cooking The diet is primarily a plant based diet; these foods should make up a majority of meals   Starches - Plant based foods should make up a majority of meals - They are an important sources of vitamins, minerals, energy, antioxidants, and fiber - Choose whole grains, foods high in fiber and minimally processed items  - Typical grain sources include wheat, oats, barley, corn, brown rice, bulgar, farro, millet, polenta, couscous  - Various types of beans include  chickpeas, lentils, fava beans, black beans, white beans   Fruits  Veggies - Large quantities of antioxidant rich fruits & veggies; 6 or more servings  - Vegetables can be eaten raw or lightly drizzled with oil and cooked  - Vegetables common to the traditional Mediterranean Diet include: artichokes, arugula, beets, broccoli, brussel sprouts, cabbage, carrots, celery, collard greens, cucumbers, eggplant, kale, leeks, lemons, lettuce, mushrooms, okra, onions, peas, peppers, potatoes, pumpkin, radishes, rutabaga, shallots, spinach, sweet potatoes, turnips, zucchini - Fruits common to the Mediterranean Diet  include: apples, apricots, avocados, cherries, clementines, dates, figs, grapefruits, grapes, melons, nectarines, oranges, peaches, pears, pomegranates, strawberries, tangerines  Fats - Replace butter and margarine with healthy oils, such as olive oil, canola oil, and tahini  - Limit nuts to no more than a handful a day  - Nuts include walnuts, almonds, pecans, pistachios, pine nuts  - Limit or avoid candied, honey roasted or heavily salted nuts - Olives are central to the [employer] - can be eaten whole or used in a variety of dishes   Meats Protein - Limiting red meat: no more than a few times a month - When eating red meat: choose lean cuts and keep the portion to the size of deck of cards - Eggs: approx. 0 to 4 times a week  - Fish and lean poultry: at least 2 a week  - Healthy protein sources include, chicken, turkey, lean beef, lamb - Increase intake of seafood such as tuna, salmon, trout, mackerel, shrimp, scallops - Avoid or limit high fat processed meats such as sausage and bacon  Dairy - Include moderate amounts of low fat dairy products  - Focus on healthy dairy such as fat free yogurt, skim milk, low or reduced fat cheese - Limit dairy products higher in fat such as whole or 2% milk, cheese, ice cream  Alcohol - Moderate amounts of red wine is ok  - No more than 5 oz daily for women (all ages) and men older than age 6  - No more than 10 oz of wine daily for men younger than 72  Other - Limit sweets and other desserts  - Use herbs and spices instead of salt to flavor foods  - Herbs and spices common to the traditional Mediterranean Diet include: basil, bay leaves, chives, cloves, cumin, fennel, garlic, lavender, marjoram, mint, oregano, parsley, pepper, rosemary, sage, savory, sumac, tarragon, thyme   It's not just a diet, it's a lifestyle:  The Mediterranean diet includes lifestyle factors typical of those in the region  Foods, drinks and meals are best eaten with others  and savored Daily physical activity is important for overall good health This could be strenuous exercise like running and aerobics This could also be more leisurely activities such as walking, housework, yard-work, or taking the stairs Moderation is the key; a balanced and healthy diet accommodates most foods and drinks Consider portion sizes and frequency of consumption of certain foods   Meal Ideas & Options:  Breakfast:  Whole wheat toast or whole wheat English muffins with peanut butter & hard boiled egg Steel cut oats topped with apples & cinnamon and skim milk  Fresh fruit: banana, strawberries, melon, berries, peaches  Smoothies: strawberries, bananas, greek yogurt, peanut butter Low fat greek yogurt with blueberries and granola  Egg white omelet with spinach and mushrooms Breakfast couscous: whole wheat couscous, apricots, skim milk, cranberries  Sandwiches:  Hummus and grilled vegetables (peppers, zucchini, squash) on whole wheat bread   Grilled chicken  on whole wheat pita with lettuce, tomatoes, cucumbers or tzatziki  Tuna salad on whole wheat bread: tuna salad made with greek yogurt, olives, red peppers, capers, green onions Garlic rosemary lamb pita: lamb sauted with garlic, rosemary, salt & pepper; add lettuce, cucumber, greek yogurt to pita - flavor with lemon juice and black pepper  Seafood:  Mediterranean grilled salmon, seasoned with garlic, basil, parsley, lemon juice and black pepper Shrimp, lemon, and spinach whole-grain pasta salad made with low fat greek yogurt  Seared scallops with lemon orzo  Seared tuna steaks seasoned salt, pepper, coriander topped with tomato mixture of olives, tomatoes, olive oil, minced garlic, parsley, green onions and cappers  Meats:  Herbed greek chicken salad with kalamata olives, cucumber, feta  Red bell peppers stuffed with spinach, bulgur, lean ground beef (or lentils) & topped with feta   Kebabs: skewers of chicken, tomatoes,  onions, zucchini, squash  Turkey burgers: made with red onions, mint, dill, lemon juice, feta cheese topped with roasted red peppers Vegetarian Cucumber salad: cucumbers, artichoke hearts, celery, red onion, feta cheese, tossed in olive oil & lemon juice  Hummus and whole grain pita points with a greek salad (lettuce, tomato, feta, olives, cucumbers, red onion) Lentil soup with celery, carrots made with vegetable broth, garlic, salt and pepper  Tabouli salad: parsley, bulgur, mint, scallions, cucumbers, tomato, radishes, lemon juice, olive oil, salt and pepper.      Fat and Cholesterol Restricted Eating Plan Getting too much fat and cholesterol in your diet may cause health problems. Choosing the right foods helps keep your fat and cholesterol at normal levels. This can keep you from getting certain diseases. Your doctor may recommend an eating plan that includes: Total fat: ______% or less of total calories a day. This is ______g of fat a day. Saturated fat: ______% or less of total calories a day. This is ______g of saturated fat a day. Cholesterol: less than _________mg a day. Fiber: ______g a day. What are tips for following this plan? General tips Work with your doctor to lose weight if you need to. Avoid: Foods with added sugar. Fried foods. Foods with trans fat or partially hydrogenated oils. This includes some margarines and baked goods. If you drink alcohol: Limit how much you have to: 0-1 drink a day for women who are not pregnant. 0-2 drinks a day for men. Know how much alcohol is in a drink. In the [place] one drink equals one 12 oz bottle of beer (355 mL), one 5 oz glass of wine (148 mL), or one 1 oz glass of hard liquor (44 mL). Reading food labels Check food labels for: Trans fats. Partially hydrogenated oils. Saturated fat (g) in each serving. Cholesterol (mg) in each serving. Fiber (g) in each serving. Choose foods with healthy fats, such as: Monounsaturated fats  and polyunsaturated fats. These include olive and canola oil, flaxseeds, walnuts, almonds, and seeds. Omega-3 fats. These are found in certain fish, flaxseed oil, and ground flaxseeds. Choose grain products that have whole grains. Look for the word whole as the first word in the ingredient list. Cooking Cook foods using low-fat methods. These include baking, boiling, grilling, and broiling. Eat more home-cooked foods. Eat at restaurants and buffets less often. Eat less fast food. Avoid cooking using saturated fats, such as butter, cream, palm oil, palm kernel oil, and coconut oil. Meal planning  At meals, divide your plate into four equal parts: Fill one-half of your plate with vegetables, green salads, and fruit. Fill  one-fourth of your plate with whole grains. Fill one-fourth of your plate with low-fat (lean) protein foods. Eat fish that is high in omega-3 fats at least two times a week. This includes mackerel, tuna, sardines, and salmon. Eat foods that are high in fiber, such as whole grains, beans, apples, pears, berries, broccoli, carrots, peas, and barley. What foods should I eat? Fruits All fresh, canned (in natural juice), or frozen fruits. Vegetables Fresh or frozen vegetables (raw, steamed, roasted, or grilled). Green salads. Grains Whole grains, such as whole wheat or whole grain breads, crackers, cereals, and pasta. Unsweetened oatmeal, bulgur, barley, quinoa, or brown rice. Corn or whole wheat flour tortillas. Meats and other protein foods Ground beef (85% or leaner), grass-fed beef, or beef trimmed of fat. Skinless chicken or turkey. Ground chicken or turkey. Pork trimmed of fat. All fish and seafood. Egg whites. Dried beans, peas, or lentils. Unsalted nuts or seeds. Unsalted canned beans. Nut butters without added sugar or oil. Dairy Low-fat or nonfat dairy products, such as skim or 1% milk, 2% or reduced-fat cheeses, low-fat and fat-free ricotta or cottage cheese, or plain  low-fat and nonfat yogurt. Fats and oils Tub margarine without trans fats. Light or reduced-fat mayonnaise and salad dressings. Avocado. Olive, canola, sesame, or safflower oils. The items listed above may not be a complete list of foods and beverages you can eat. Contact a dietitian for more information. What foods should I avoid? Fruits Canned fruit in heavy syrup. Fruit in cream or butter sauce. Fried fruit. Vegetables Vegetables cooked in cheese, cream, or butter sauce. Fried vegetables. Grains White bread. White pasta. White rice. Cornbread. Bagels, pastries, and croissants. Crackers and snack foods that contain trans fat and hydrogenated oils. Meats and other protein foods Fatty cuts of meat. Ribs, chicken wings, bacon, sausage, bologna, salami, chitterlings, fatback, hot dogs, bratwurst, and packaged lunch meats. Liver and organ meats. Whole eggs and egg yolks. Chicken and turkey with skin. Fried meat. Dairy Whole or 2% milk, cream, half-and-half, and cream cheese. Whole milk cheeses. Whole-fat or sweetened yogurt. Full-fat cheeses. Nondairy creamers and whipped toppings. Processed cheese, cheese spreads, and cheese curds. Fats and oils Butter, stick margarine, lard, shortening, ghee, or bacon fat. Coconut, palm kernel, and palm oils. Beverages Alcohol. Sugar-sweetened drinks such as sodas, lemonade, and fruit drinks. Sweets and desserts Corn syrup, sugars, honey, and molasses. Candy. Jam and jelly. Syrup. Sweetened cereals. Cookies, pies, cakes, donuts, muffins, and ice cream. The items listed above may not be a complete list of foods and beverages you should avoid. Contact a dietitian for more information. Summary Choosing the right foods helps keep your fat and cholesterol at normal levels. This can keep you from getting certain diseases. At meals, fill one-half of your plate with vegetables, green salads, and fruits. Eat high fiber foods, like whole grains, beans, apples, pears,  berries, carrots, peas, and barley. Limit added sugar, saturated fats, alcohol, and fried foods. This information is not intended to replace advice given to you by your health care provider. Make sure you discuss any questions you have with your health care provider. Document Revised: 07/12/2020 Document Reviewed: 07/12/2020 Elsevier Patient Education  2024 [employer].

## 2024-02-14 NOTE — Progress Notes (Signed)
 Complete physical exam  Patient: Sara Minardi SmithFemale    DOB: August 14, 1975 48 y.o.   MRN: [id number] Subjective:    Discussed the use of AI scribe software for clinical note transcription with the patient, who gave verbal consent to proceed.  History of Present Illness   Lamar RAMAN Zackary Octaviano is an 48 year old female who presents with calf pain after a recent stumble.  He has been experiencing pain in his left calf after stumbling while getting out of a car on Thursday. The pain is described as achy pain that occurs primarily when weight is placed on the leg. It is localized to the back of the leg and is associated with swelling. He rates the pain as a 5 out of 10 when walking, noting that it is particularly severe in the morning and improves slightly as the day progresses.  He has been taking two Tylenol  tablets in the morning since Thursday to manage the pain, which he believes are 500 mg each. He has also been using a heating pad, which provides some relief. He generally wears compression socks due to baseline swelling in his legs.  Pt denies worsening swelling in his legs, sob, chest pain.  No fever, chills, breathing difficulties, chest pain, abdominal discomfort, dizziness, lightheadedness, nausea, vomiting, urinary problems, or tingling and numbness in the affected leg. He reports no pain in the knee, hip, or ankle areas.  His wife notes that he has not been using a walker until this incident, but due to the severity of the pain, she purchased one for him to prevent falls.  Most recent fall risk assessment:    02/14/2024    2:20 PM  Fall Risk   Falls in the past year? 0  Number falls in past yr: 0  Injury with Fall? 0  Risk for fall due to : No Fall Risks  Follow up Falls evaluation completed     Most recent depression screenings:    02/14/2024    2:21 PM 02/03/2023    3:11 PM 12/31/2022    4:03 PM  Depression screen PHQ 2/9  Decreased Interest 0 0 0  Down, Depressed,  Hopeless 0 0 0  PHQ - 2 Score 0 0 0  Altered sleeping 0  0  Tired, decreased energy 1  1  Change in appetite 0  0  Feeling bad or failure about yourself  0  0  Trouble concentrating 0  0  Moving slowly or fidgety/restless 0  0  Suicidal thoughts 0  0  PHQ-9 Score 1  1   Difficult doing work/chores Not difficult at all  Not difficult at all     Data saved with a previous flowsheet row definition    Patient Active Problem List   Diagnosis Date Noted   Arthritis of right acromioclavicular joint 01/07/2024   History of COVID-19 08/06/2020   Obesity (BMI 30.0-34.9) 05/16/2018   BMI 33.0-33.9,adult 01/24/2015   Migraine without aura 11/30/2013   Past Medical History:  Diagnosis Date   Allergy    High triglycerides    Migraines    Umbilical hernia 01/14/2013   Past Surgical History:  Procedure Laterality Date   DILATION AND CURETTAGE OF UTERUS  01/09/2004   non-viable fetus   HERNIA REPAIR  01/23/2013   Umbilical Hernia   HYSTEROSCOPY WITH NOVASURE     UMBILICAL HERNIA REPAIR N/A 01/23/2013   Procedure: HERNIA REPAIR UMBILICAL ADULT;  Surgeon: Donnice Bury, MD;  Location: [facility];  Service:  General;  Laterality: N/A;   Social History   Tobacco Use   Smoking status: Never   Smokeless tobacco: Never  Vaping Use   Vaping status: Never Used  Substance Use Topics   Alcohol use: No    Alcohol/week: 0.0 standard drinks of alcohol   Drug use: No   Family History  Problem Relation Age of Onset   Colon polyps Mother    Diabetes Mother    Uterine cancer Mother    Hypertension Mother    Cancer Mother    Hypertension Father    Heart disease Father    Hyperlipidemia Brother    Hyperlipidemia Brother    Heart disease Maternal Aunt    Early death Maternal Aunt    Heart disease Maternal Uncle    Early death Maternal Uncle    Heart disease Paternal Aunt    Heart disease Paternal Uncle    Diabetes Maternal Grandmother    Hypothyroidism Maternal  Grandmother    Diabetes Maternal Grandfather    Hypertension Maternal Grandfather    Heart disease Maternal Grandfather    Cancer Maternal Grandfather        lung w/ mets to bone   Stroke Paternal Grandmother    Cancer Paternal Grandmother    Heart disease Paternal Grandfather    Asthma Daughter    Breast cancer Neg Hx    Colon cancer Neg Hx    Esophageal cancer Neg Hx    Rectal cancer Neg Hx    Stomach cancer Neg Hx    Allergies  Allergen Reactions   Amoxicillin Hives   Septra [Bactrim] Hives   Sulfamethoxazole-Trimethoprim Other (See Comments)   Gemfibrozil Other (See Comments)    Extreme achiness     Review of Systems  Constitutional:  Negative for chills and fever.  HENT:  Negative for congestion and sore throat.   Eyes:  Negative for blurred vision.  Respiratory:  Negative for cough, sputum production and shortness of breath.   Cardiovascular:  Negative for chest pain, palpitations and leg swelling.  Gastrointestinal:  Negative for abdominal pain, heartburn and nausea.  Genitourinary:  Negative for dysuria, frequency and hematuria.  Musculoskeletal:  Negative for falls and myalgias.  Skin:  Negative for rash.  Neurological:  Negative for dizziness.  Endo/Heme/Allergies:  Does not bruise/bleed easily.  Psychiatric/Behavioral:  Negative for depression.       Objective:    BP 128/89   Pulse 85   Temp 98.3 F (36.8 C) (Oral)   Ht 5' 1 (1.549 m)   Wt 191 lb (86.6 kg)   SpO2 96%   BMI 36.09 kg/m  BP Readings from Last 3 Encounters:  02/14/24 128/89  10/04/23 125/74  04/23/23 110/76      Physical Exam Constitutional:      Appearance: Normal appearance.  HENT:     Head: Normocephalic and atraumatic.     Right Ear: Tympanic membrane normal.     Nose: No congestion.     Mouth/Throat:     Pharynx: Posterior oropharyngeal erythema present. No oropharyngeal exudate.  Eyes:     Pupils: Pupils are equal, round, and reactive to light.  Cardiovascular:      Rate and Rhythm: Normal rate and regular rhythm.     Heart sounds: Normal heart sounds. No murmur heard. Pulmonary:     Effort: Pulmonary effort is normal. No respiratory distress.     Breath sounds: Normal breath sounds. No wheezing.  Abdominal:     General: Bowel sounds are  normal. There is no distension.     Tenderness: There is no abdominal tenderness. There is no guarding or rebound.     Comments:    Musculoskeletal:        General: No swelling or tenderness.     Cervical back: No tenderness.  Lymphadenopathy:     Cervical: No cervical adenopathy.  Skin:    General: Skin is warm.  Neurological:     Mental Status: She is alert. Mental status is at baseline.     Sensory: No sensory deficit.     Motor: No weakness.  Psychiatric:        Mood and Affect: Mood normal.        Thought Content: Thought content normal.      No results found for any visits on 02/14/24. Last CBC Lab Results  Component Value Date   WBC 4.2 02/10/2023   HGB 15.2 02/10/2023   HCT 44.9 02/10/2023   MCV 91 02/10/2023   MCH 30.6 02/10/2023   RDW 11.9 02/10/2023   PLT 281 02/10/2023   Last metabolic panel Lab Results  Component Value Date   GLUCOSE 78 02/10/2023   NA 140 02/10/2023   K 3.9 02/10/2023   CL 102 02/10/2023   CO2 20 02/10/2023   BUN 8 02/10/2023   CREATININE 0.89 02/10/2023   EGFR 81 02/10/2023   CALCIUM  9.1 02/10/2023   PROT 7.4 02/10/2023   ALBUMIN 4.8 02/10/2023   LABGLOB 2.6 02/10/2023   AGRATIO 2.1 01/20/2022   BILITOT 0.6 02/10/2023   ALKPHOS 106 02/10/2023   AST 21 02/10/2023   ALT 34 (H) 02/10/2023   Last lipids Lab Results  Component Value Date   CHOL 154 02/10/2023   HDL 32 (L) 02/10/2023   LDLCALC 72 02/10/2023   TRIG 308 (H) 02/10/2023   CHOLHDL 4.8 (H) 02/10/2023   Last hemoglobin A1c No results found for: HGBA1C Last thyroid functions Lab Results  Component Value Date   TSH 1.480 05/16/2018   Last vitamin D No results found for: 25OHVITD2,  25OHVITD3, VD25OH Last vitamin B12 and Folate No results found for: VITAMINB12, FOLATE       Assessment & Plan:    Routine Health Maintenance and Physical Exam Health Maintenance  Topic Date Due   HIV Screening  Never done   Hepatitis C Screening  Never done   Hepatitis B Vaccines 19-59 Average Risk (1 of 3 - 19+ 3-dose series) Never done   Mammogram  03/26/2024   COVID-19 Vaccine (3 - Pfizer risk series) 03/01/2025 (Originally 05/12/2019)   Cervical Cancer Screening (HPV/Pap Cotest)  05/25/2024   Colonoscopy  04/22/2028   DTaP/Tdap/Td (2 - Td or Tdap) 01/16/2031   Influenza Vaccine  Completed   Pneumococcal Vaccine  Aged Out   HPV VACCINES  Aged Out   Meningococcal B Vaccine  Aged Out    Discussed health benefits of physical activity, and encouraged her to engage in regular exercise appropriate for her age and condition.  Assessment & Plan Mixed hyperlipidemia Will check lipid panel Orders:   Lipid panel; Future   rosuvastatin  (CRESTOR ) 20 MG tablet; Take 1 tablet (20 mg total) by mouth daily for cholesterol  Routine general medical examination at a health care facility Pt has no concerns today  Instructed to exercise regularly 150 min/ week  Already vaccinated for flu shot  Labs ordered  Mammogram ordered Referral placed to GYN  Orders:   COMPLETE METABOLIC PANEL WITHOUT GFR; Future  Visit for screening mammogram  Orders:   MM 3D SCREENING MAMMOGRAM BILATERAL BREAST; Future  Screening for deficiency anemia  Orders:   CBC with Differential/Platelet; Future  Cervical cancer screening  Orders:   Ambulatory referral to Gynecology  Screening for diabetes mellitus  Orders:   Hemoglobin A1c; Future  Screening for thyroid disorder  Orders:   TSH; Future  Screening for STD (sexually transmitted disease)  Orders:   Hepatitis C antibody   HIV Antibody (routine testing w rflx)     No follow-ups on file. 1 yr     Jackalyn Blazing,  MD [facility]

## 2024-02-15 ENCOUNTER — Other Ambulatory Visit (HOSPITAL_COMMUNITY): Payer: Self-pay

## 2024-02-21 ENCOUNTER — Encounter (INDEPENDENT_AMBULATORY_CARE_PROVIDER_SITE_OTHER): Payer: Self-pay | Admitting: Foot & Ankle Surgery

## 2024-02-22 ENCOUNTER — Encounter (INDEPENDENT_AMBULATORY_CARE_PROVIDER_SITE_OTHER): Payer: Self-pay | Admitting: Foot & Ankle Surgery

## 2024-02-24 ENCOUNTER — Other Ambulatory Visit

## 2024-02-24 DIAGNOSIS — Z113 Encounter for screening for infections with a predominantly sexual mode of transmission: Secondary | ICD-10-CM

## 2024-02-24 DIAGNOSIS — Z0000 Encounter for general adult medical examination without abnormal findings: Secondary | ICD-10-CM | POA: Diagnosis not present

## 2024-02-24 DIAGNOSIS — Z131 Encounter for screening for diabetes mellitus: Secondary | ICD-10-CM

## 2024-02-24 DIAGNOSIS — E782 Mixed hyperlipidemia: Secondary | ICD-10-CM | POA: Diagnosis not present

## 2024-02-24 DIAGNOSIS — Z130 Encounter for screening for diseases of the blood and blood-forming organs and certain disorders involving the immune mechanism: Secondary | ICD-10-CM | POA: Diagnosis not present

## 2024-02-24 DIAGNOSIS — Z1329 Encounter for screening for other suspected endocrine disorder: Secondary | ICD-10-CM

## 2024-02-24 LAB — LIPID PANEL
Cholesterol: 172 mg/dL (ref 0–200)
HDL: 39 mg/dL — ABNORMAL LOW (ref >39.00)
LDL Cholesterol: 68 mg/dL (ref 0–99)
NonHDL: 133.47
Total CHOL/HDL Ratio: 4
Triglycerides: 325 mg/dL — ABNORMAL HIGH (ref 0.0–149.0)
VLDL: 65 mg/dL — ABNORMAL HIGH (ref 0.0–40.0)

## 2024-02-24 LAB — CBC WITH DIFFERENTIAL/PLATELET
Basophils Absolute: 0 K/uL (ref 0.0–0.1)
Basophils Relative: 0.8 % (ref 0.0–3.0)
Eosinophils Absolute: 0.1 K/uL (ref 0.0–0.7)
Eosinophils Relative: 1.7 % (ref 0.0–5.0)
HCT: 45.1 % (ref 36.0–46.0)
Hemoglobin: 15.7 g/dL — ABNORMAL HIGH (ref 12.0–15.0)
Lymphocytes Relative: 31.2 % (ref 12.0–46.0)
Lymphs Abs: 1.7 K/uL (ref 0.7–4.0)
MCHC: 34.8 g/dL (ref 30.0–36.0)
MCV: 88.7 fl (ref 78.0–100.0)
Monocytes Absolute: 0.5 K/uL (ref 0.1–1.0)
Monocytes Relative: 8.8 % (ref 3.0–12.0)
Neutro Abs: 3.2 K/uL (ref 1.4–7.7)
Neutrophils Relative %: 57.5 % (ref 43.0–77.0)
Platelets: 326 K/uL (ref 150.0–400.0)
RBC: 5.08 Mil/uL (ref 3.87–5.11)
RDW: 12.6 % (ref 11.5–15.5)
WBC: 5.5 K/uL (ref 4.0–10.5)

## 2024-02-24 LAB — TSH: TSH: 1.21 u[IU]/mL (ref 0.35–5.50)

## 2024-02-24 LAB — HEMOGLOBIN A1C: Hgb A1c MFr Bld: 5 % (ref 4.6–6.5)

## 2024-02-25 ENCOUNTER — Ambulatory Visit: Payer: Self-pay | Admitting: Sports Medicine

## 2024-02-26 LAB — COMPLETE METABOLIC PANEL WITHOUT GFR
AG Ratio: 1.9 (calc) (ref 1.0–2.5)
ALT: 37 U/L — ABNORMAL HIGH (ref 6–29)
AST: 20 U/L (ref 10–35)
Albumin: 4.9 g/dL (ref 3.6–5.1)
Alkaline phosphatase (APISO): 75 U/L (ref 31–125)
BUN: 13 mg/dL (ref 7–25)
CO2: 28 mmol/L (ref 20–32)
Calcium: 9.3 mg/dL (ref 8.6–10.2)
Chloride: 104 mmol/L (ref 98–110)
Creat: 0.97 mg/dL (ref 0.50–0.99)
Globulin: 2.6 g/dL (ref 1.9–3.7)
Glucose, Bld: 90 mg/dL (ref 65–99)
Potassium: 4.4 mmol/L (ref 3.5–5.3)
Sodium: 139 mmol/L (ref 135–146)
Total Bilirubin: 0.6 mg/dL (ref 0.2–1.2)
Total Protein: 7.5 g/dL (ref 6.1–8.1)

## 2024-02-26 LAB — HIV ANTIBODY (ROUTINE TESTING W REFLEX)
HIV 1&2 Ab, 4th Generation: NONREACTIVE
HIV FINAL INTERPRETATION: NEGATIVE

## 2024-02-26 LAB — HEPATITIS C ANTIBODY: Hepatitis C Ab: NONREACTIVE

## 2024-02-28 NOTE — Telephone Encounter (Signed)
 Lipid Panel     Component Value Date/Time   CHOL 172 02/24/2024 0803   CHOL 154 02/10/2023 1155   TRIG 325.0 (H) 02/24/2024 0803   HDL 39.00 (L) 02/24/2024 0803   HDL 32 (L) 02/10/2023 1155   CHOLHDL 4 02/24/2024 0803   VLDL 65.0 (H) 02/24/2024 0803   LDLCALC 68 02/24/2024 0803   LDLCALC 72 02/10/2023 1155   LABVLDL 50 (H) 02/10/2023 1155

## 2024-02-29 ENCOUNTER — Encounter (INDEPENDENT_AMBULATORY_CARE_PROVIDER_SITE_OTHER): Payer: Self-pay | Admitting: Foot & Ankle Surgery

## 2024-02-29 ENCOUNTER — Encounter (INDEPENDENT_AMBULATORY_CARE_PROVIDER_SITE_OTHER): Payer: Self-pay

## 2024-03-06 ENCOUNTER — Encounter (INDEPENDENT_AMBULATORY_CARE_PROVIDER_SITE_OTHER): Payer: Self-pay | Admitting: Foot & Ankle Surgery

## 2024-03-06 ENCOUNTER — Ambulatory Visit (INDEPENDENT_AMBULATORY_CARE_PROVIDER_SITE_OTHER): Admitting: Foot & Ankle Surgery

## 2024-03-06 VITALS — BP 116/80 | HR 70 | Ht 64.0 in | Wt 170.0 lb

## 2024-03-06 DIAGNOSIS — M21619 Bunion of unspecified foot: Secondary | ICD-10-CM

## 2024-03-06 DIAGNOSIS — M79671 Pain in right foot: Secondary | ICD-10-CM

## 2024-03-06 MED ORDER — OXYCODONE HCL 5 MG PO TABS: 5.0000 mg | ORAL_TABLET | Freq: Four times a day (QID) | ORAL | 0 refills | Status: AC | PRN

## 2024-03-06 MED ORDER — ASPIRIN 81 MG PO TBEC: 81.0000 mg | DELAYED_RELEASE_TABLET | Freq: Two times a day (BID) | ORAL | 0 refills | Status: AC

## 2024-03-06 MED ORDER — CEPHALEXIN 500 MG PO CAPS: 500.0000 mg | ORAL_CAPSULE | Freq: Three times a day (TID) | ORAL | 0 refills | Status: AC

## 2024-03-06 NOTE — Patient Instructions (Signed)
 Arieana Somoza L. Leanne , DPM, FACFAS  [facility]        Post Operative Instructions    After a surgical operation it is normal to have some degree of discomfort and swelling. Often bruising will appear around the bandage as well as some bleeding.    If there is active bleeding that does not subside with elevating your foot and continues to soak thru the bandage, call the office at once.    Apply ice over the surgical area every  hour for the next 24 hours. Then apply ice every 2 hours (30 minutes at a time) for the following 24 hours. Do not use any form of heat.    Keep your foot elevated at all times, except to use the bathroom, for the first 48 hours.    5. If you have been instructed that it is ok to walk, please use your boot.     6. It is normal to have numbness in your foot after surgery. A long- acting anesthetic was used during your surgery that may last as long as 24 hours. Beyond that, please call.       7. Follow strictly all of the directions for prescribed medications.    8. Do not remove your dressing for any reason, unless instructed by your doctor. Your ace wrap can be send to your comfort.    9  Do not allow your dressing to get wet.       10.  Some helpful tips with managing pain: Utilize Motrin and Tylenol according to the instructions.  It is generally helpful to maintain these medications during the postoperative period but to adhere to guidelines.    600 mg of Motrin can and should be utilized unless otherwise previously discussed, or other GI or medication contraindications exist.    It is helpful to use this 3-4 times daily, or every 6-8 hours.    Additionally staggering the administration of Tylenol can be helpful as well.  It is important not to exceed 3 g of Tylenol in a 24-hour period.    If you are using extra strength Tylenol, 500 mg/pill then you  should not exceed 6 pills in a day.     11.  Your additional medications, antibiotics and aspirin /blood thinner should be  started the day after surgery unless otherwise discussed.       Please reach out with any questions or concerns in the meantime.  Nonemergent inquiries can be used sent via MyChart, more urgent things should be called into the on-call surgeon.     ==================================================================  It is important to keep you bandages/cast clean, dry, and free from weight being placed on the surgery sight.   These devices are examples of things which will help keep you off your feet following injury or surgery:   Please keep in mind these are examples of devices other patients have found to be helpful.   Dr. Leanne does not endorse these products, nor can she guarantee their price or safety.   Please make sure to follow all fitting instructions, as well as usage recommendations and restrictions.     CRUTCHES:               ==================================================================  Walkers:     ==================================================================  KNEE SCOOTERS:       ==================================================================  Cast Protectors:     ==================================================================        This is an example of a surgical shoe.  This can be less cumbersome than a fracture boot.   You should only wear this shoe in lieu of a fracture boot when your Doctor suggests it.

## 2024-03-06 NOTE — Progress Notes (Unsigned)
 Podiatric Surgery Patient Visit    Patient Name: Amanda Compton, Amanda Compton    Assessment and Plan:     Assessment & Plan  Bunion of right foot  Hallux valgus deformity of the right foot with medial eminence and second toe impingement is scheduled for surgical correction. The procedure involves a small incision behind the bunion, through-and-through metatarsal osteotomy, and fixation with two permanent titanium screws. A possible additional hallux osteotomy may be performed to relieve second toe crowding. The anticipated outcome is improved alignment and function. She is expected to ambulate in a boot postoperatively and transition to a sneaker at four weeks. Surgery is scheduled for December 30th. Instructions for Hibiclens scrub the night before and morning of surgery have been provided. She is advised to fast for 24 hours preoperatively due to Zepbound use, with clear liquids permitted until midnight before surgery. Nursing staff have been notified to provide the fasting protocol. Oxycodone  is prescribed for postoperative pain management, with instructions for safe use of ibuprofen and acetaminophen. Postoperative antibiotics are prescribed to begin the day after surgery, with intraoperative antibiotics planned prior to incision. Aspirin  is prescribed for postoperative venous thromboembolism prophylaxis. She is advised to keep the surgical area clean and dry, using a cast cover over bandages. She has a boot and is instructed to bring it on the day of surgery. Crutches are provided for postoperative balance. She is instructed to wear the boot at all times when ambulating and may remove it when non-weightbearing. Transition to a sneaker is advised at approximately four weeks postoperatively. All prescriptions have been sent to [facility] pharmacy for pickup.    Bunion of left foot  Hallux valgus deformity of the left foot is scheduled for tendon release and possible osteotomy. No hardware is anticipated, but  a small pin may be used if necessary for toe alignment; if placed, it will be removed in the office at four weeks. The left foot will be wrapped postoperatively and a surgical shoe provided. The anticipated outcome is improved toe alignment and function. She is expected to ambulate in a surgical shoe postoperatively and transition to a sneaker at four weeks. The procedure is scheduled for December 30th. Instructions for Hibiclens scrub the night before (whole body) and morning of surgery (foot up to knee) have been provided. She is advised to fast for 24 hours preoperatively due to Zepbound use, with clear liquids permitted until midnight before surgery. Nursing staff have been notified to provide the fasting protocol. Oxycodone  is prescribed for postoperative pain management, with instructions for safe use of ibuprofen and acetaminophen. Postoperative antibiotics are prescribed to begin the day after surgery, with intraoperative antibiotics planned prior to incision. Aspirin  is prescribed for postoperative venous thromboembolism prophylaxis. She is advised to keep the surgical area clean and dry, using a cast cover over bandages. A surgical shoe will be provided at the surgery center, and she is instructed to wear it for four weeks postoperatively. Crutches are provided for postoperative balance. She is instructed to wear the surgical shoe at all times when ambulating and may remove it when non-weightbearing. Transition to a sneaker is advised at approximately four weeks postoperatively. All prescriptions have been sent to [facility] pharmacy for pickup.        -Patient instructed to call with any questions, concerns, or worsening of symptoms    Subjective:     Chief Complaint:   Chief Complaint   Patient presents with    Pre-op Exam     DOS 03/14/2024 Video Visit  Right foot        HPI:   History of Present Illness  Amanda Compton is a 48 year old female with bilateral hallux valgus who presents for preoperative  evaluation and surgical planning.    Hallux valgus and associated forefoot deformities  - Bilateral hallux valgus with right foot bunion and digital crowding, and left foot toe deformity.  - Radiographs confirm presence of hallux valgus, bunions, and digital crowding.  - No difficulty with ambulation over long distances.  - High pain tolerance, but uncertain about expected postoperative pain.    Preoperative planning and preparation  - Scheduled for bilateral foot surgery on December 30th.  - Previously received a walking boot and will bring it to surgery.  - Completed paperwork for handicap parking placard.    Weight management therapy  - Zepbound 10 mg weekly for weight management.      Verbal consent has been obtained from the patient to conduct a video and telephone: yes     DAX Copilot Consent:   The patient and/or family gave verbal consent today for  use of DAX Copilot with electronic transmission using iPhone for the purposes of capturing audio into the patient's chart/designated cloud.     Denies N/V/F/C/SOB/CP    All other systems were reviewed and are negative    The following portions of the patient's history were reviewed and updated as appropriate: allergies, current medications, past medical history, past surgical history, family history, and problem list.    Physical Exam:       Physical Exam      PHYSICAL EXAMINATION  The following limited physical exam components were observed via video:    Pt is a well developed, well nourished 48 y.o. year old female who is awake, alert, and oriented.      General Appearance:  Alert, cooperative, no distress, appears stated age     Head: Normocephalic, without obvious abnormality, atraumatic     Eyes:  Sclera anicteric, conjunctiva without pallor     Lungs:   Breathing comfortably without accessory muscle use. Normal respiratory effort.      Skin: Normal skin color, texture, no visible rashes or lesions     Neurologic: Alert and oriented x3, normal mood and  affect. Grossly intact.          MSK - Gait: NA     Extremities: R foot bunion, l                 Radiology:    Results  Radiology  Right foot x-ray: Hallux valgus deformity with medial eminence of the first metatarsal head; second toe impingement by hallux. (Independently interpreted)  Left foot x-ray: Hallux valgus deformity. (Independently interpreted)           Amanda Compton L. Leanne, DPM, FACFAS  [facility]    ------------------------------------------------------------------------------------------------------------------------------  The review of the patient's medications does not in any way constitute an endorsement, by this clinician,  of their use, dosage, indications, route, efficacy, interactions, or other clinical parameters.    This note was generated within the EP[employer] EMR using Dragon medical speech recognition software and may contain inherent errors or omissions not intended by the user. Grammatical and punctuation errors, random word insertions, deletions, pronoun errors and incomplete sentences are occasional consequences of this technology due to software limitations. Not all errors are caught or corrected.  Although every attempt is made to root out erroneus and incomplete transcription, the note may still not fully  represent the intent or opinion of the author. If there are questions or concerns about the content of this note or information contained within the body of this dictation they should be addressed directly with the author for clarification.

## 2024-03-08 ENCOUNTER — Encounter (INDEPENDENT_AMBULATORY_CARE_PROVIDER_SITE_OTHER): Payer: Self-pay

## 2024-03-08 ENCOUNTER — Ambulatory Visit (INDEPENDENT_AMBULATORY_CARE_PROVIDER_SITE_OTHER)

## 2024-03-08 NOTE — PSS Phone Screening (Addendum)
 Pre-Anesthesia Evaluation    Pre-op phone visit requested by:   Reason for pre-op phone visit: Patient anticipating BUNION CORRECTION, OSTEOTOMY,  4TH TOE CORRECTION procedure.    Language Assistant  Interpreter: N/A - English is preferred language    No orders of the defined types were placed in this encounter.    01/13/24 CBC, CMP, TSH, T4F (WDL) in Epic (scan).  Pt verbalized understanding of GLP-1 fasting guidelines. Advised that failure to follow the fasting guidelines may increase risk of complications and could result in the cancellation or delay of surgery.  CHG instructions provided to pt - pt verbalized understanding.     History of Present Illness/Summary:      Problem List:  Medical Problems       Hospital Problem List  Date Reviewed: 01/20/2024   None        Non-Hospital Problem List  Date Reviewed: 01/20/2024          ICD-10-CM Priority Class Noted Diagnosed    Nocturia R35.1   10/07/2016     Stress incontinence in female N39.3   10/07/2016         Medical History   Diagnosis Date    Eczema     Foot pain, bilateral     Obesity     Recurrent UTI     last treated ~ 12/2023    Rosacea      Past Surgical History[1]     Medication List            Accurate as of March 08, 2024 10:48 AM. Always use your most recent med list.                aspirin  EC 81 MG EC tablet  Take 1 tablet (81 mg) by mouth 2 (two) times daily  Notes to patient: prescribed for post-op     cephALEXin  500 MG capsule  Take 1 capsule (500 mg) by mouth 3 (three) times daily for 5 days  Commonly known as: KEFLEX   Notes to patient: prescribed for post-op     clobetasol 0.05 % external solution  APPLY TO SCALP AND BEHIND EARS EXTERNALLY 2-3 TIMES A WEEK AS NEEDED FOR FLARES 30 DAYS  Commonly known as: TEMOVATE  Medication Adjustments for Surgery: Hold day of surgery     estradiol 10 MCG Tabs  Place 10 mcg vaginally twice a week     fluticasone 50 MCG/ACT nasal spray  1 spray by Nasal route as needed  Commonly known as: FLONASE  Medication  Adjustments for Surgery: Take as needed     ketoconazole 2 % shampoo  APPLY TO SCALP, ONCE A DAY, LET SIT FOR 3-5 MINUTES BEFORE RINSING AS NEEDED FOR FLARES EXTERNALLY ONCE A DAY 30 DAYS  Commonly known as: NIZORAL  Medication Adjustments for Surgery: Take as prescribed     metroNIDAZOLE 0.75 % cream  APPLY TO FACE EXTERNALLY TWICE A DAY 30 DAYS  Commonly known as: METROCREAM  Medication Adjustments for Surgery: Hold day of surgery     ondansetron 4 MG tablet  Commonly known as: ZOFRAN  Medication Adjustments for Surgery: Take as needed     oxyCODONE  5 MG immediate release tablet  Take 1 tablet (5 mg) by mouth every 6 (six) hours as needed for Pain  Commonly known as: ROXICODONE   Notes to patient: prescribed for post-op     PROBIOTIC PO  Take 1 tablet by mouth once daily  Medication Adjustments for Surgery: Stop now  vitamin D3 125 MCG (5000 UT) Caps capsule  Take 1 capsule (125 mcg) by mouth once daily  Commonly known as: CHOLECALCIFEROL  Medication Adjustments for Surgery: Hold day of surgery     Zepbound 10 MG/0.5ML injection  Inject 0.5 mLs (10 mg) into the skin Once each week on Wednesday morning  Generic drug: tirzepatide  Notes to patient: Last dose was 03/08/24            Allergies[2]  Family History[3]  Social History     Occupational History    Not on file   Tobacco Use    Smoking status: Never    Smokeless tobacco: Never   Vaping Use    Vaping status: Never Used   Substance and Sexual Activity    Alcohol use: Yes     Alcohol/week: 0.0 - 1.0 standard drinks of alcohol    Drug use: Never    Sexual activity: Not on file       Menstrual History:   LMP / Status  Having periods     Patient's last menstrual period was 02/14/2024 (exact date).    Tubal Ligation?  No valid surgical or medical questions entered.           PREOPERATIVE FASTING GUIDELINES    Special Fasting Instructions  Abstain from solid food and non-clear liquids for a full 24 hrs  Abstain from high carbohydrate clear liquids (i.e., ClearFast)  for 8 hrs  Abstain from all clear liquids for 4 hrs    ** Examples of clear liquids include water, apple juice, sports drinks such as Gatorade, and coffee or tea without cream or milk. Sugar or sweetener may be added.    Note:  These instructions replace the standard fasting instructions on the Preparing for Surgery website.          Exam Scores:   SDB score  OSA Risk Category: No Risk        STBUR score       PONV score  Nausea Risk: VERY SEVERE RISK    MST score  MST Score: 0    PEN-FAST score       Frailty score       CHADsVasc            Visit Vitals  Ht 1.626 m (5' 4)   Wt 71.2 kg (157 lb)   LMP 02/14/2024 (Exact Date)   BMI 26.95 kg/m   Overweight based on BMI.                                       [1]   Past Surgical History:  Procedure Laterality Date    GYN SLING  2019    LASIK      SINUS SURGERY  2016   [2] No Known Allergies  [3]   Family History  Problem Relation Name Age of Onset    Stroke Father      Hypertension Maternal Grandmother

## 2024-03-13 ENCOUNTER — Encounter (INDEPENDENT_AMBULATORY_CARE_PROVIDER_SITE_OTHER): Payer: Self-pay

## 2024-03-14 ENCOUNTER — Ambulatory Visit: Admission: RE | Admit: 2024-03-14 | Source: Ambulatory Visit | Admitting: Foot & Ankle Surgery

## 2024-03-14 ENCOUNTER — Encounter: Admission: RE | Payer: Self-pay | Source: Ambulatory Visit

## 2024-03-14 ENCOUNTER — Encounter (INDEPENDENT_AMBULATORY_CARE_PROVIDER_SITE_OTHER): Payer: Self-pay

## 2024-03-14 SURGERY — OSTEOTOMY, FOOT
Anesthesia: Anesthesia General | Laterality: Right

## 2024-03-15 ENCOUNTER — Encounter (INDEPENDENT_AMBULATORY_CARE_PROVIDER_SITE_OTHER): Payer: Self-pay

## 2024-03-15 NOTE — Progress Notes (Signed)
 Spoke with patient, forms filled out for 8 weeks from new surgery date.       [employer]   Fax    [phone]

## 2024-03-17 NOTE — Progress Notes (Signed)
 Surgical date mistakenly written as 2025 instead of 2026. Corrected and refaxed to       [facility]   Fax    [phone]

## 2024-03-29 ENCOUNTER — Ambulatory Visit (INDEPENDENT_AMBULATORY_CARE_PROVIDER_SITE_OTHER): Admitting: Foot & Ankle Surgery

## 2024-03-30 ENCOUNTER — Inpatient Hospital Stay: Admission: RE | Admit: 2024-03-30 | Discharge: 2024-03-30 | Attending: Sports Medicine

## 2024-03-30 ENCOUNTER — Telehealth (INDEPENDENT_AMBULATORY_CARE_PROVIDER_SITE_OTHER): Payer: Self-pay

## 2024-03-30 DIAGNOSIS — Z1231 Encounter for screening mammogram for malignant neoplasm of breast: Secondary | ICD-10-CM

## 2024-03-30 NOTE — Telephone Encounter (Signed)
 [facility] Nav note: Courtesy call message left for pt for 04/04/24 DOS who had previous [facility] RN interview for now rescheduled procedure.  Reminders left on VM for date/location/arrival time per surgeon's office/have responsible person to drive home/bring ID & insurance info if applies, no jewelry or valuables, and fasting instructions. [facility] RN contact info [phone] #5 left for questions.

## 2024-03-31 NOTE — Anesthesia Preprocedure Evaluation (Signed)
 Anesthesia Evaluation    AIRWAY    Mallampati: III    TM distance: >3 FB  Neck ROM: full  Mouth Opening:full   CARDIOVASCULAR    cardiovascular exam normal       DENTAL    no notable dental hx               PULMONARY    pulmonary exam normal and clear to auscultation     OTHER FINDINGS    ===============================================================  Inpatient Anesthesia Evaluation    Patient Name: Amanda Compton, Amanda Compton  Patient Age / Sex: 49 y.o. / female    Medical History:  Past Medical History:  No date: Eczema  No date: Foot pain, bilateral  No date: Obesity  No date: Recurrent UTI      Comment:  last treated ~ 12/2023  No date: Rosacea    Past Surgical History:  2019: GYN SLING  No date: LASIK  2016: SINUS SURGERY      Allergies:  No Known Allergies      Medications:  No current facility-administered medications for this encounter.            No medications prior to admission.    Vitals       Wt Readings from Last 3 Encounters:  03/08/24 : 71.2 kg (157 lb)  03/06/24 : 77.1 kg (170 lb)  01/20/24 : 77.1 kg (170 lb)    BMI (Estimated body mass index is 26.95 kg/m as calculated from the following:    Height as of 03/08/24: 1.626 m (5' 4).    Weight as of 03/08/24: 71.2 kg (157 lb).)  Temp Readings from Last 3 Encounters:  02/23/23 : 36.7 C (98 F) (Tympanic)    BP Readings from Last 3 Encounters:  03/06/24 : 116/80  02/23/23 : 116/80  10/07/16 : 129/85    Pulse Readings from Last 3 Encounters:  03/06/24 : 70  02/23/23 : 70  10/07/16 : 61          Labs:  CBC:  No results found for: WBC, HGB, HCT, PLT    '  Chemistry   No results found for: NA, K, CL, CO2, BUN, CREAT, GLU No results found for: CA, ALKPHOS, AST, ALT, BILITOTAL        Coags:  No results found for: PT, PTT, INR  _____________________      Signed by: Diego GORMAN Blanch, MD  March 31, 2024  2:06 PM    =============================================================                                            Relevant  Problems   No relevant active problems               Anesthesia Plan    ASA 2     general               (Risks of general anesthesia explained to patient; including the most common side effects from anesthesia--nausea, vomiting, and sore throat. Uncommon side effects including but not limited to injury to eyes, lips, teeth, gums, vocal cords, allergic reactions, and nerve injuries also explained. Additional risks associated with patients known health conditions and issues also addressed. Patient given the opportunity to ask questions and concerns were resolved. Patient understands and wishes to proceed.     Diego Blanch, MD    )  intravenous induction   Detailed anesthesia plan: general LMA        Post op pain management: per surgeon    informed consent obtained    Plan discussed with CRNA and attending.    ECG reviewed  pertinent labs reviewed               Signed by: Diego GORMAN Blanch, MD 03/31/2024 2:06 PM

## 2024-04-02 NOTE — H&P (Signed)
 Foot & Ankle Surgery Pre-Op H&P Note  Spectra  [phone] / Pager [phone]    Date Time: 04/04/2024 8:19 AM  Patient Name: Amanda Compton  Attending Physician: Leanne Katayama, DPM      Chief Complaint:   Bilateral foot pain    History of Present Illness:   Amanda Compton is a 49 y.o. female with PMH significant for eczema, recurrent UTIs, obesity who presents to the hospital with bilateral foot pain with bunions and digital contracture. Denies nausea, vomiting, fever, chills, shortness of breath, calf pain, or other abnormalities. NPO for planned bilateral foot surgery today.     Patient verbally agreed to allow photography of foot for management and monitoring of their current condition and understands images may be stored in their medical record.     Past Medical History:   Medical History[1]    Past Surgical History:   Past Surgical History[2]    Family History:   Family History[3]    Social History:   Social History[4]    Allergies:   Allergies[5]    Medications:   Prescriptions Prior to Admission[6]    Review of Systems:   A comprehensive review of systems was negative, except for that stated in HPI.    Physical Exam:   There were no vitals filed for this visit.    There is no height or weight on file to calculate BMI.    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    Problem List:   Active Problems:    * No active hospital problems. *  Resolved Problems:    * No resolved hospital problems. *      Assessment:   Amanda Compton is a 49 y.o. 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 Patient exhibited appropriate understanding of all discussion points. Informed consent obtained with no guarantees to surgical outcome given or implied.      Tinnie Engel, DPM PGY3  Resident - Foot and Ankle Surgery   [facility]   [phone] (Podiatry On Call Pager)      I have seen the patient and evaluated them myself  Proceed with surgery as scheduled.    The patient(family) was counseled as to the indications, goals, options,  alternatives, risks, benefits, and potential complications of surgical intervention, including infection, bleeding, pain, failure, non/mal-union, gait issues, over/under correction, stiffness, weakness, injury to local structures,  need for further surgery, wound problems, as well as potential Medical and anesthesia complications, including death, MI, CVA, VTE pneumonia, UTI, among others.  She/He/They understand(s) and wish(es) to proceed. In addition, the anticipated healing and rehabilitative course was discussed. Opportunity was given to ask and receive answers to all relevant questions.    Corine L. Leanne, DPM,  FACFAS           [1]   Past Medical History:  Diagnosis Date    Eczema     Foot pain, bilateral     Obesity     Recurrent UTI     last treated ~ 12/2023    Rosacea    [2]    Past Surgical History:  Procedure Laterality Date    GYN SLING  2019    LASIK      SINUS SURGERY  2016   [3]   Family History  Problem Relation Name Age of Onset    Stroke Father      Hypertension Maternal Grandmother     [4]   Social History  Socioeconomic History    Marital status: Unknown   Tobacco Use    Smoking status: Never    Smokeless tobacco: Never   Vaping Use    Vaping status: Never Used   Substance and Sexual Activity    Alcohol use: Yes     Alcohol/week: 0.0 - 1.0 standard drinks of alcohol    Drug use: Never     Social Drivers of Psychologist, [employer] Strain: Low Risk (04/02/2024)    Overall Financial Resource Strain (CARDIA)     Difficulty of Paying Living Expenses: Not hard at all   Food Insecurity: No Food Insecurity (04/02/2024)    Hunger Vital Sign     Worried About Running Out of Food in the Last Year: Never true     Ran Out of Food in the Last Year: Never true   Transportation Needs: No Transportation Needs (04/02/2024)    PRAPARE - Therapist, Art (Medical): No     Lack of Transportation (Non-Medical): No   Physical Activity: Insufficiently Active (04/02/2024)    Exercise Vital Sign     Days of Exercise per Week: 2 days     Minutes of Exercise per Session: 30 min   Stress: No Stress Concern Present (04/02/2024)    [employer] of Occupational Health - Occupational Stress Questionnaire     Feeling of Stress : Not at all   Social Connections: Unknown (07/27/2021)    Received from [facility]    Social Network     Social Network: Not on file   Intimate Partner Violence: Not At Risk (04/02/2024)    Humiliation, Afraid, Rape, and Kick questionnaire     Fear of Current or Ex-Partner: No     Emotionally Abused: No     Physically Abused: No     Sexually Abused: No   Housing Stability: Not At Risk (04/02/2024)    Housing Stability NCSS     Do you have housing?: Yes     Are you worried about losing your housing?: No   [5] No Known Allergies  [6]   Medications Prior to  Admission   Medication Sig Dispense Refill Last Dose/Taking    aspirin  EC 81 MG EC tablet  Take 1 tablet (81 mg) by mouth 2 (two) times daily (Patient not taking: Reported on 03/08/2024) 30 tablet 0     clobetasol (TEMOVATE) 0.05 % external solution APPLY TO SCALP AND BEHIND EARS EXTERNALLY 2-3 TIMES A WEEK AS NEEDED FOR FLARES 30 DAYS (Patient taking differently: Apply topically as needed) 50 mL 1     estradiol 10 MCG Tablet Place 10 mcg vaginally twice a week       fluticasone (FLONASE) 50 MCG/ACT nasal spray 1 spray by Nasal route as needed       ketoconazole (NIZORAL) 2 % shampoo APPLY TO SCALP, ONCE A DAY, LET SIT FOR 3-5 MINUTES BEFORE RINSING AS NEEDED FOR FLARES EXTERNALLY ONCE A DAY 30 DAYS (Patient taking differently: Apply topically as needed) 120 mL 5     metroNIDAZOLE (METROCREAM) 0.75 % cream APPLY TO FACE EXTERNALLY TWICE A DAY 30 DAYS (Patient taking differently: Apply topically once at bedtime) 45 g 5     ondansetron  (ZOFRAN ) 4 MG tablet  (Patient taking differently: Take 1 tablet (4 mg) by mouth as needed)       Probiotic Product (PROBIOTIC PO) Take 1 tablet by mouth once daily       vitamin D3 (CHOLECALCIFEROL) 125 MCG (5000 UT) Cap capsule Take 1 capsule (125 mcg) by mouth once daily       Zepbound 10 MG/0.5ML injection Inject 0.5 mLs (10 mg) into the skin Once each week on Wednesday morning

## 2024-04-03 MED ORDER — ROSUVASTATIN CALCIUM 40 MG PO TABS: 40.0000 mg | ORAL_TABLET | Freq: Every day | ORAL | 1 refills | Status: AC

## 2024-04-03 NOTE — Discharge Instr - AVS First Page (Addendum)
 Foot & Ankle Surgery Patient Discharge Instructions:    Arrange to have an adult drive you home after surgery. If you had general anesthesia, it may take a day or more to fully recover. So, for at least the next 24 hours: Do not drive or use machinery or power tools; do not drink alcohol; and do not make any major decisions.     What to Expect  It is normal to have the following:  Bruising and slight swelling of the foot and toes  A small amount of blood on the dressing    Bandages:  Keep your dressings clean, dry, and intact. Do not remove or change.  When bathing/showering, cover the bandage or cast with a plastic bag to keep it dry.  Dont remove your bandage until your doctor tells you to. If your bandage gets wet or dirty, check with your doctor. You can likely replace it with a clean, dry one.    Activity:  Weight Bearing As Tolerated to the  operative lower extremity with surgical boot and post-op shoe and assistive devices until your follow up appointment or cleared by your doctor.  Sit or lay down when possible. Put a pillow under your heel to raise your foot above the level of your heart.  Wrap an ice pack or bag of frozen peas in a thin cloth. Place it over your bandaged foot for no longer than 20 minutes. Do this three times a day.  You can drive again when instructed by your doctor.  Wear your surgical shoe at all times unless told otherwise by your health care provider.  Use crutches or a cane as directed.  Follow your doctors instructions about putting weight on your foot.    Diet:  Start with liquids and light foods (such as dry toast, bananas, and apple sauce). As you feel up to it, slowly return to your normal diet.  Drink at least six to eight glasses of water  or other nonalcoholic fluids a day.  To avoid nausea, eat before taking narcotic pain medications.    Medications:  Take all medications as instructed.  Take pain medications on time. Do not wait until the pain is bad before taking  your medications.  Avoid alcohol while on pain medications.    Call your doctor if:  Continuous bleeding through the bandage  Excessive swelling, increased bleeding, or redness  Fever over 100.37F or chills  Pain unrelieved by pain medications  Foot feels cold to the touch or numb  Chest pain or shortness of breath

## 2024-04-03 NOTE — Op Note (Signed)
 Foot & Ankle Surgery Full Operative Note  Spectra  [phone] / Pager [phone]      PATIENT NAME:  Amanda Compton   49 y.o. female     DATE OF OPERATION:  04/04/2024     PROVIDERS PERFORMING:  Surgeon(s):  Leanne Blanchard CROME, DPM    Assistant(s):  First Assist: Princess Tinnie PARAS, DPM, Resident    DIAGNOSIS:  Right foot pain [M79.671]  Left foot pain [M79.672]     PROCEDURE(S):  Bunionectomy with Distal First Metatarsal Osteotomy, Right Foot   Akin Great Toe Osteotomy, Right Foot   Middle Phalanx Osteotomy, Left Fourth Toe      ANESTHESIA:   Anesthesia General   No responsible provider has been recorded for the case.   Anesthesiologist: Tobie Diego RAMAN, MD  CRNA: Missy Romero RAMAN, CRNA     Preoperatively: 20cc of 0.5% bupivacaine   Postoperatively: 15cc of 0.5% bupivacaine     ESTIMATED BLOOD LOSS:  30cc    HEMOSTASIS:  - Anatomical dissection, mechanical compression, electrocautery    No tourniquet was used throughout the entire procedure  * No tourniquets in log *     IMPLANTS:  Implant Name Type Inv. Item Serial No. Model No. Manufacturer Lot No. LRB No. Used Action   IMPLANT TOE JOINT PECA 2.0 [id number] - [id number] Screw IMPLANT TOE JOINT PECA 2.0 [id number]  [id number] [employer] [id number] Right 1 Implanted   IMPLANT TOE JOINT PECA 2.0 [id number] - [id number] Screw IMPLANT TOE JOINT PECA 2.0 [id number]  [id number] [employer] [id number] Right 1 Implanted   SCREW BONE L20 MM OD3 MM PECA 2.0 - [id number] Screw SCREW BONE L20 MM OD3 MM PECA 2.0  [id number] [employer] [id number] Right 1 Implanted        MATERIALS:   Vicryl, Prolene     SPECIMENS:  None    ANTIBIOTICS:  2 grams of Ancef   preoperatively     DRAINS:  None     COMPLICATIONS:  Patient tolerated the procedure well without complication.      INDICATIONS FOR PROCEDURE:  Amanda Compton presents with a chief complaint of the bilateral foot pain due to a right bunion and left fourth toe digital contracture. The patient has failed conservative treatments  of various modalities. After discussing all conservative and surgical options, the patient has elected to proceed with surgical intervention.    All alternatives, risks, and complications of the procedures were thoroughly explained to the patient. Patient exhibits appropriate understanding of all discussion points and informed consent was signed and obtained in the chart with no guarantees to surgical outcome given or implied.      DESCRIPTION OF PROCEDURE:  Patient was brought to the operating room and placed on the operative table in the supine position. Patient was secured to the table with safety belt, and a contralateral SCD was placed.  A surgical timeout was performed and all members of the operating room, the procedure, and surgical site were identified. General anesthesia was induced. Local anesthesia was then administered in a local infiltrative block using 20cc of 0.5% Marcaine  plain to the bilateral feet.     The bilateral lower extremity was then prepped and draped in the usual sterile manner. The left foot was then covered with a sterile towel and coban. The following procedure was then undertaken starting on the right foot.    PROCEDURE    Attention was directed to the right lower extremity at the first ray. Under fluoroscopy,  anatomic landmarks along the first metatarsal were identified. Then with a #15 blade, a small percutaneous stab incision was made just proximal to the medial eminence.  Dissection was deepened with a hemostat, and an elevator was used to release the periosteum and soft tissue structures from the first metatarsal just proximal to the sesamoids for the osteotomy. A guidepin was then inserted from medial to lateral across the first metatarsal at the site of the osteotomy, with the wire advanced to the lateral aspect of the fibular sesamoid.  Positioning of the guide pin was checked under fluoroscopy. The jig system was then assembled.  After assessing proper jig placement under  fluoroscopy, 2 percutaneous stab incisions were made with a #15 blade and deepened with a hemostat to allow for placement of the 2 guidewires through the sleeves within the jig.  The 2 wires were then advanced from the proximal medial aspect of the first metatarsal to the lateral cortex of the distal metatarsal shaft.  Fluoroscopy confirmed adequate positioning of the wires.  Attention was then directed back to the guide pin at the osteotomy site.  This was then removed.  Then utilizing a high torque low speed 2.19mm x15mm burr, a transverse osteotomy was then made on the first metatarsal just proximal to the sesamoids.  The toe was distracted which confirmed complete osteotomy. The Cobra, small, then large reducer were sequentially used in order to translate the capital fragment laterally and de-rotate the fragment.  After adequate translation and rotation, the 2 guidewires were then advanced into the first metatarsal head.  The guide pin placements were checked radiographically and found to be acceptable.  The guide pins were then measured and drilled. The osteotomy was then fixed with two screws of appropriate length. Final fluoroscopic images were taken which demonstrated improvement in the intermetatarsal angle with adequate positioning of the capital fragment and hardware well-seated.      Attention was then directed to the right hallux to perform an Akin osteotomy. Under fluoroscopy, a guidewire was placed from the proximal medial aspect of the proximal phalangeal base to the lateral cortex at the level of the diaphysis. A percutaneous incision was made at the medial aspect of the proximal phalangeal diaphysis with a #15 blade.  Blunt dissection was continued with a hemostat and an elevator was utilized to free the dorsal cortex of periosteum. Then a 2.26mm x12 mm burr was utilized to create an oblique Akin osteotomy, preserving the lateral hinge. The osteotomy was then closed down and the previously mentioned  guidewire was advanced through the lateral cortex.  A stab incision was then made with a #15 blade along the guidewire, and then the guidewire was measured, drilled, and a 3.95mm screw was placed to compress the osteotomy. There was some loss of compression and correction, so it was decided to remove the 3.66mm screw and up size to a 4.37mm screw. The 4.48mm screw was inserted and the osteotomy was well compressed. The guidewire was then removed. Fluoroscopy confirmed proper positioning and length of the screw, and the osteotomy was stressed and appeared stable after fixation.    Attention was then directed back to the level of the first metatarsal head.  Through the previous percutaneous incision sites, a 2.58mm x 22mm burr was then introduced to resect the prominent medial shelf of the first metatarsal. This overhanging wedge of bone was then removed with a hemostat and ronguer. The 3.53mm x 13mm wedge burr was then utilized to resect any remaining prominence medially  and on the dorsal medial eminence of the first metatarsal head.  This was done utilizing copious irrigation to prevent thermal necrosis.  Bone paste was then expressed out and adequate resection was confirmed under fluoroscopy to remove the prominent medial eminence.      The incision sites were then copiously irrigated with normal sterile saline. The deep tissue layer at the incision site for the 2 screws was closed with 4-0 Vicryl. The skin of the incision sites were well-approximated under minimal tension with 3-0 Prolene.     The sterile towel and coban was now removed from the left foot. Attention was directed to the left fourth toe in which there was a reducible contracture at the DIPJ level. With a #15 blade, a small stab incision was made at the lateral aspect of the middle phalanx of the fourth toe and then dissection deepened with a hemostat. At this time, a 2.53mm x 8mm burr was then used to perform an osteotomy of the fourth middle phalanx, with  a dorsal based wedge. The osteotomy was then greensticked and then confirmed under fluoroscopy. The digit was radiographically and clinically noted to be in rectus alignment. The incision was then irrigated and then closed with 3-0 Prolene. Steri-strips were applied for splintage.    The surgical site was then dressed with steri-strips, adaptic, 4x4 gauze, ABD pad, kerlix, and ACE wrap. The patient tolerated both the procedure and anesthesia well with vital signs stable throughout. The patient was transferred from the OR to recovery under the discretion of anesthesia.    CONDITION:    Patient was taken to PACU in good condition and all vital signs stable.    DISCHARGE:    The patient will be discharged with all postoperative instructions and prescriptions when cleared by anesthesia and will follow up in the office of the surgeon.       Tinnie Engel, DPM PGY3  Resident - Foot and Ankle Surgery   [facility]   [phone] (Podiatry On Call Pager)       Attending Addendum/Attestation:  I agree with the above operative note.  I was present and scrubbed throughout the entirety of this procedure. Resident participation was directly supervised by me. All key aspects of the procedure were completed by me directly.  Additionally, I have edited this note to reflect my findings and plan as well as to incorporate any new data.    Corine L. Leanne MAUL, FACFAS  [facility]

## 2024-04-04 ENCOUNTER — Ambulatory Visit
Admission: RE | Admit: 2024-04-04 | Discharge: 2024-04-04 | Disposition: A | Discharge status: Home / Self Care | Source: Ambulatory Visit | Attending: Foot & Ankle Surgery | Admitting: Foot & Ankle Surgery

## 2024-04-04 ENCOUNTER — Ambulatory Visit: Payer: Self-pay | Admitting: Anesthesiology

## 2024-04-04 ENCOUNTER — Encounter: Payer: Self-pay | Admitting: Foot & Ankle Surgery

## 2024-04-04 ENCOUNTER — Encounter: Admission: RE | Disposition: A | Payer: Self-pay | Source: Ambulatory Visit | Attending: Foot & Ankle Surgery

## 2024-04-04 ENCOUNTER — Ambulatory Visit

## 2024-04-04 DIAGNOSIS — M205X2 Other deformities of toe(s) (acquired), left foot: Secondary | ICD-10-CM | POA: Insufficient documentation

## 2024-04-04 DIAGNOSIS — M2011 Hallux valgus (acquired), right foot: Secondary | ICD-10-CM | POA: Insufficient documentation

## 2024-04-04 DIAGNOSIS — M21611 Bunion of right foot: Secondary | ICD-10-CM | POA: Insufficient documentation

## 2024-04-04 HISTORY — PX: BUNIONECTOMY, OSTEOTOMY: SHX3354

## 2024-04-04 HISTORY — PX: OSTEOTOMY, TOE: SHX4954

## 2024-04-04 LAB — LAB USE ONLY - LAVENDER - EDTA HOLD TUBE

## 2024-04-04 LAB — POCT PREGNANCY TEST, URINE HCG: POCT Pregnancy HCG Test, UR: NEGATIVE

## 2024-04-04 LAB — PERSONNEL HEALTH HEPATITIS B SURFACE ANTIGEN: Hepatitis B Surface Antigen: NONREACTIVE

## 2024-04-04 LAB — PERSONNEL HEALTH HEPATITIS C VIRUS ANTIBODY: Hepatitis C Antibody: NONREACTIVE

## 2024-04-04 LAB — HEPATITIS B (HBV) SURFACE ANTIGEN WITH REFLEX TO CONFIRMATION: Hepatitis B Surface Antigen: NONREACTIVE

## 2024-04-04 LAB — HIV-1/2, ANTIGEN AND ANTIBODY WITH REFLEX TO CONFIRMATION: HIV Ag/Ab 4th Generation: NONREACTIVE

## 2024-04-04 LAB — HEPATITIS C ANTIBODY, TOTAL: Hepatitis C Antibody: NONREACTIVE

## 2024-04-04 MED ORDER — ONDANSETRON HCL 4 MG/2ML IJ SOLN
INTRAMUSCULAR | Status: DC | PRN
  Administered 2024-04-04: 4 mg via INTRAVENOUS

## 2024-04-04 MED ORDER — ACETAMINOPHEN 500 MG PO TABS
1000.0000 mg | ORAL_TABLET | Freq: Once | ORAL | Status: AC
  Administered 2024-04-04: 1000 mg via ORAL

## 2024-04-04 MED ORDER — FENTANYL CITRATE (PF) 50 MCG/ML IJ SOLN (WRAP)
INTRAMUSCULAR | Status: DC | PRN
  Administered 2024-04-04 (×3): 50 ug via INTRAVENOUS

## 2024-04-04 MED ORDER — FAMOTIDINE 10 MG/ML IV SOLN (WRAP)
INTRAVENOUS | Status: AC
  Filled 2024-04-04: qty 2

## 2024-04-04 MED ORDER — STERILE WATER FOR INJECTION IJ/IV SOLN (WRAP)
2.0000 g | Freq: Once | Status: AC
  Administered 2024-04-04: 2 g via INTRAVENOUS

## 2024-04-04 MED ORDER — BUPIVACAINE HCL (PF) 0.5 % IJ SOLN
INTRAMUSCULAR | Status: AC
  Filled 2024-04-04: qty 30

## 2024-04-04 MED ORDER — LIDOCAINE HCL (PF) 2 % IJ SOLN
INTRAMUSCULAR | Status: AC
  Filled 2024-04-04: qty 5

## 2024-04-04 MED ORDER — BUPIVACAINE HCL 0.5 % IJ SOLN
INTRAMUSCULAR | Status: DC | PRN
  Administered 2024-04-04: 5 mL
  Administered 2024-04-04: 15 mL
  Administered 2024-04-04: 10 mL
  Administered 2024-04-04: 5 mL

## 2024-04-04 MED ORDER — SODIUM CHLORIDE 0.9% BAG (IRRIGATION USE)
INTRAVENOUS | Status: DC | PRN
  Administered 2024-04-04: 1000 mL

## 2024-04-04 MED ORDER — CEFAZOLIN SODIUM 2 G IJ SOLR
INTRAMUSCULAR | Status: AC
  Filled 2024-04-04: qty 2000

## 2024-04-04 MED ORDER — OXYCODONE HCL 5 MG PO TABS: 5.0000 mg | ORAL_TABLET | Freq: Once | ORAL | Status: DC | PRN

## 2024-04-04 MED ORDER — PROPOFOL INFUSION 10 MG/ML
INTRAVENOUS | Status: DC | PRN
  Administered 2024-04-04: 140 ug/kg/min via INTRAVENOUS
  Administered 2024-04-04: 200 mg via INTRAVENOUS

## 2024-04-04 MED ORDER — BUPIVACAINE HCL (PF) 0.5 % IJ SOLN
INTRAMUSCULAR | Status: AC
  Filled 2024-04-04: qty 10

## 2024-04-04 MED ORDER — PROPOFOL 10 MG/ML IV EMUL (WRAP)
INTRAVENOUS | Status: AC
  Filled 2024-04-04: qty 20

## 2024-04-04 MED ORDER — DEXAMETHASONE SODIUM PHOSPHATE 4 MG/ML IJ SOLN (WRAP)
INTRAMUSCULAR | Status: DC | PRN
  Administered 2024-04-04: 4 mg via INTRAVENOUS

## 2024-04-04 MED ORDER — HYDROMORPHONE HCL 1 MG/ML IJ SOLN: 0.5000 mg | INTRAMUSCULAR | Status: DC | PRN

## 2024-04-04 MED ORDER — MIDAZOLAM HCL 1 MG/ML IJ SOLN (WRAP)
INTRAMUSCULAR | Status: DC | PRN
  Administered 2024-04-04: 2 mg via INTRAVENOUS

## 2024-04-04 MED ORDER — BENZOCAINE-MENTHOL MT LOZG (WRAP): 1.0000 | LOZENGE | Freq: Once | OROMUCOSAL | Status: DC | PRN

## 2024-04-04 MED ORDER — ONDANSETRON HCL 4 MG/2ML IJ SOLN: 4.0000 mg | Freq: Once | INTRAMUSCULAR | Status: DC | PRN

## 2024-04-04 MED ORDER — LACTATED RINGERS IV SOLN
INTRAVENOUS | Status: DC
  Administered 2024-04-04: 1000 mL via INTRAVENOUS
  Filled 2024-04-04: qty 1000

## 2024-04-04 MED ORDER — LIDOCAINE HCL (PF) 2 % IJ SOLN
INTRAMUSCULAR | Status: DC | PRN
  Administered 2024-04-04: 50 mg via INTRAVENOUS

## 2024-04-04 MED ORDER — FENTANYL CITRATE (PF) 50 MCG/ML IJ SOLN (WRAP)
INTRAMUSCULAR | Status: AC
  Filled 2024-04-04: qty 2

## 2024-04-04 MED ORDER — FAMOTIDINE 10 MG/ML IV SOLN (WRAP)
20.0000 mg | Freq: Once | INTRAVENOUS | Status: AC
  Administered 2024-04-04: 20 mg via INTRAVENOUS

## 2024-04-04 MED ORDER — FENTANYL CITRATE (PF) 50 MCG/ML IJ SOLN (WRAP): 25.0000 ug | INTRAMUSCULAR | Status: DC | PRN

## 2024-04-04 MED ORDER — ACETAMINOPHEN 500 MG PO TABS
ORAL_TABLET | ORAL | Status: AC
  Filled 2024-04-04: qty 2

## 2024-04-04 MED ORDER — MIDAZOLAM HCL 1 MG/ML IJ SOLN (WRAP)
INTRAMUSCULAR | Status: AC
  Filled 2024-04-04: qty 2

## 2024-04-04 NOTE — Anesthesia Postprocedure Evaluation (Signed)
 Anesthesia Post Evaluation    Patient: Amanda Compton    BUNIONECTOMY, OSTEOTOMY (Right: Foot)  OSTEOTOMY, TOE 4TH (Left: Toe)    Anesthesia type: general    Last Vitals:   Vitals Value Taken Time   BP 118/76 04/04/24 12:20   Temp 36.4 C (97.6 F) 04/04/24 12:16   Pulse 77 04/04/24 12:20   Resp 17 04/04/24 12:20   SpO2 99 % 04/04/24 12:20                 Anesthesia Post Evaluation:     Patient Evaluated: PACU  Patient Participation: complete - patient participated  Level of Consciousness: awake and alert    Pain Management: adequate  Multimodal analgesia pain management approach  Strategies: local anesthesia, acetaminophen  and steroids  Airway Patency: patent  Two or more mitigation strategies used for obstructive sleep apnea.  Strategies: multimodal analgesia, verification of full NMB reversal and extubation/recovery carried out in nonsupine position    Anesthetic complications: No      PONV Status: none    Cardiovascular status: acceptable  Respiratory status: acceptable  Hydration status: acceptable          Signed by: Diego GORMAN Blanch, MD, 04/04/2024 12:27 PM

## 2024-04-04 NOTE — Discharge Instructions (Addendum)
 Given for nausea prevention:  Famotidine  (PEPCID ) injection 20mg  given at  0842am  .  Ondansetron  (ZOFRAN ) injection 4mg  given at  1023am  .    Given for pain management:  Acetaminophen  (TYLENOL ) tablet 1,000 mg given at 841am  .       Next dose may be given at: 241pm    Ibuprofen not given. May take at anytime    Antibiotics  Ancef  2 gram injection given at 1008am___.     Anesthesia: After Your Surgery  Youve just had surgery. During surgery, you received medication called anesthesia to keep you comfortable and pain-free. After surgery, you may experience some pain or nausea. This is normal. Here are some tips for feeling better and recovering after surgery.     Stay on schedule with your medication.   Going Home  Your doctor or nurse will show you how to take care of yourself when you go home. He or she will also answer your questions. Have an adult family member or friend drive you home. For the first 24 hours after your surgery:  Do not drive or use heavy equipment.  Do not make important decisions or sign documents.  Avoid alcohol.  Have someone stay with you, if possible. They can watch for problems and help keep you safe.  Be sure to keep all follow-up doctors appointments. And rest after your procedure for as long as your doctor tells you to.    Coping with Pain  If you have pain after surgery, pain medication will help you feel better. Take it as directed, before pain becomes severe. Also, ask your doctor or pharmacist about other ways to control pain, such as with heat, ice, and relaxation. And follow any other instructions your surgeon or nurse gives you.    Tips for Taking Pain Medication  To get the best relief possible, remember these points:  Pain medications can upset your stomach. Taking them with a little food may help.  Most pain relievers taken by mouth need at least 20 to 30 minutes to take effect.  Taking medication on a schedule can help you remember to take it. Try to time your medication  so that you can take it before beginning an activity, such as dressing, walking, or sitting down for dinner.  Constipation is a common side effect of pain medications. Drink lots of fluids. Eating fruit and vegetables can also help. Dont take laxatives unless your surgeon has prescribed them.  Mixing alcohol and pain medication can cause dizziness and slow your breathing. It can even be fatal. Dont drink alcohol while taking pain medication.  Pain medication can slow your reflexes. Dont drive or operate machinery while taking pain medication,    Managing Nausea  Some people have an upset stomach after surgery. This is often due to anesthesia, pain, pain medications, or the stress of surgery. The following tips will help you manage nausea and get good nutrition as you recover. If you were on a special diet before surgery, ask your doctor if you should follow it during recovery. These tips may help:  Dont push yourself to eat. Your body will tell you what to eat and when.  Start off with liquids and soup. They are easier to digest.  Progress to semisolids (mashed potatoes, applesauce, and gelatin) as you feel ready.  Slowly move to solid foods. Dont eat fatty, rich, or spicy foods at first.  Dont force yourself to have three large meals a day. Instead, eat smaller  amounts more often.  Take pain medications with a small amount of solid food, such as crackers or toast.    Call Your Surgeon If  You still have pain an hour after taking medication (it may not be strong enough).  You feel too sleepy, dizzy, or groggy (medication may be too strong).  You have side effects like nausea, vomiting, or skin changes (rash, itching, or hives).  Signs of infection - fever over 101, chills. Incision site redness, warmth, swelling, foul odor or purulent drainage.  Persistent bleeding at the operative site.    [address] [place], [place] [address]. All rights reserved. This information is not  intended as a substitute for professional medical care. Always follow your healthcare professional's instructions.

## 2024-04-04 NOTE — Brief Op Note (Signed)
 Foot & Ankle Surgery Brief Operative Note  Spectra  [phone] / Pager [phone]    Date/Time: 04/04/2024 12:09 PM     Patient Name:   Amanda Compton    Date of Operation:   04/04/2024    Providers Performing:   Surgeon(s):  Leanne Blanchard CROME, DPM    Assistant(s):  First Assist: Princess Tinnie PARAS, DPM, Resident    Diagnosis:   Right foot pain [M79.671]  Left foot pain [M79.672]    Operative Procedure:   Procedure(s):  RIGHT BUNIONECTOMY, DISTAL FIRST METATARSAL OSTEOTOMY  RIGHT AKIN OSTEOTOMY  MIDDLE PHALANX OSTEOTOMY, LEFT FOURTH TOE    Anesthesia:   Anesthesia General  Tobie Diego RAMAN, MD  Anesthesiologist: Tobie Diego RAMAN, MD  CRNA: Missy Romero RAMAN, CRNA    Antibiotics:   2 grams of Ancef   preoperatively    Hemostasis:   - Anatomic dissection, mechanical compression, electrocautery   * No tourniquets in log *  Estimated Blood Loss:   30cc    Materials:   Vicryl and Prolene  Drains: None    Implants:     Implant Name Type Inv. Item Serial No. Manufacturer Lot No. LRB No. 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    Tinnie Princess, DPM PGY3  Resident - Foot and Ankle Surgery   [facility]   [phone] (Podiatry On Call Pager)

## 2024-04-05 ENCOUNTER — Encounter: Payer: Self-pay | Admitting: Foot & Ankle Surgery

## 2024-04-06 LAB — HEPATITIS C VIRUS RNA, QUANTITATIVE RT PCR
Hepatitis C virus RNA, Quantitative RT PCR IU/mL: NOT DETECTED [IU]/mL
Hepatitis C virus RNA, Quantitative RT PCR Log IU/mL: NOT DETECTED {Log_IU}/mL

## 2024-04-07 ENCOUNTER — Encounter (INDEPENDENT_AMBULATORY_CARE_PROVIDER_SITE_OTHER): Payer: Self-pay | Admitting: Foot & Ankle Surgery

## 2024-04-17 ENCOUNTER — Encounter (INDEPENDENT_AMBULATORY_CARE_PROVIDER_SITE_OTHER): Payer: Self-pay | Admitting: Foot & Ankle Surgery

## 2024-04-19 ENCOUNTER — Ambulatory Visit (INDEPENDENT_AMBULATORY_CARE_PROVIDER_SITE_OTHER): Admitting: Foot & Ankle Surgery

## 2024-04-19 ENCOUNTER — Encounter (INDEPENDENT_AMBULATORY_CARE_PROVIDER_SITE_OTHER): Payer: Self-pay | Admitting: Foot & Ankle Surgery

## 2024-04-19 VITALS — Ht 64.0 in | Wt 159.0 lb

## 2024-04-19 DIAGNOSIS — M21619 Bunion of unspecified foot: Secondary | ICD-10-CM

## 2024-04-19 DIAGNOSIS — M2042 Other hammer toe(s) (acquired), left foot: Secondary | ICD-10-CM

## 2024-04-19 NOTE — Patient Instructions (Signed)
Scar Massage     Massaging your scars is important. It keeps the tissue around the incision loose so it doesn't "stick" to the tissue underneath.   Wait until after your skin has healed before you start massaging your scar.   Your skin will be healed when the edges of the scar look pinkish, are well closed with no gaps, and have no drainage.  Massage the scar for 5 minutes, 2 to 3 times a day. Keep doing these massages every day for 3-6 months  after surgery.  You may feel some pulling or burning. Loosening the scar may be more comfortable to do while the skin is warm (e.g., after a shower).   It is advised to utilize a vitamin E solution or Cocoa Butter cream to help desensitize the area.     Towards the scar  Place the flat part of your fingers above the scar. Move the skin and the tissue under it downward, towards the scar, but not over it. Hold for a few seconds. Make sure that you press enough to feel the scar "move" under your fingertips.  Place your fingers below the scar. Move the skin and tissue under it upwards, towards the scar. Hold for a few seconds.  Move your fingers along to the next section of scar, and repeat steps 1 and 2 until you've massaged all along the scar from both directions.    ==================================================================================================================================================    Back and forth  Put the flat part of your fingers on the scar. Move the skin and tissue under the scar back and forth, holding for a few seconds. Make sure you press enough to feel the scar "move" under your fingertips.  Move your fingers along to the next section of scar, and repeat until you've massaged all along the scar.    ==================================================================================================================================================  Circles  Put the flat part of your fingers on the scar. Move the skin and tissue under the scar  in a small circle, holding for a few seconds. Make sure you press enough to feel the scar "move" under your fingertips.  Move your fingers along to the next section of scar, and repeat until you've massaged all along the scar.    ==================================================================================================================================================

## 2024-04-19 NOTE — Progress Notes (Signed)
 Podiatric Surgery Patient Visit    Patient Name: Amanda Compton, Amanda Compton    Assessment and Plan:     Assessment & Plan  Postoperative right hallux valgus correction  Recovering well following right hallux valgus correction with osteotomy and screw fixation. Sesamoids are realigned, and the toe is straight and stable. Swelling and stiffness are present, as expected postoperatively. Transient sharp, shooting pain in both feet resolved, likely related to bandaging. She tolerates activity in the boot without issues. Early mobilization is beneficial for bone healing, and screw stability is not a concern given her body habitus and activity level.  - Instructed her to continue wearing the boot for two more weeks, then transition to a sneaker at four weeks postoperatively.  - Provided silicone spacer for daytime use in the boot, with instructions to remove at night and during exercises.  - Demonstrated and instructed on scar tissue massage to begin in a few days, using vitamin E or cocoa butter.  - Demonstrated and instructed on toe exercises including passive motion, toe curls, and towel scrunches, to be performed as tolerated.  - Advised gentle joint warm-up before stretching and removal of spacer during exercises.  - Scheduled follow-up in four weeks for repeat radiographs and assessment of bone healing.  - Provided anticipatory guidance regarding return to work, with flexibility based on her recovery and readiness.    Postoperative left fourth toe digital deformity correction  Status post left fourth toe digital deformity correction with osteotomy and tendon release. Toe remains swollen and sensitive, with expected postoperative discomfort. Suture removal was necessary due to local swelling and sensitivity. Ongoing taping and bandaging are required to maintain alignment and prevent recurrence of deformity during healing.  - Removed suture from left fourth toe due to sensitivity and swelling.  - Applied  bandaging and demonstrated taping technique to maintain hyperextension of the toe.  - Instructed her to continue using the surgical shoe for two more weeks to prevent the toe from curling into the shoe.  - Advised ongoing taping of the toe and provided supplies for home use.  - Scheduled follow-up in four weeks to reassess alignment and healing.  - Advised transition to sneaker for both feet in two weeks, contingent on healing progress.        -Patient instructed to call with any questions, concerns, or worsening of symptoms    Subjective:     Chief Complaint:   Chief Complaint   Patient presents with    Post-op     Bunion right, osteotomy  left, doing ok       HPI:   History of Present Illness  Amanda Compton is a 49 year old female with right hallux valgus and left fourth toe digital contracture who presents for postoperative follow-up after bilateral foot surgery.    Postoperative Recovery and Foot Pain:  - Underwent right hallux valgus correction and left fourth toe digital deformity correction on April 04, 2024, including osteotomies and tendon releases  - Ambulating primarily in a boot; has access to a scooter but has not required frequent use  - Able to ambulate in the boot without significant difficulty  - Experienced a brief episode of sharp, shooting pain in both feet localized to the toes, lasting two to three days, now resolved  - No ongoing significant pain  - Performing toe movements and tolerates passive and active motion without significant discomfort  - Difficulty bending the right hallux due to swelling and intentional postoperative immobilization    Gastrointestinal  Symptoms:  - Experienced diarrhea and hematochezia, attributed to recent use of Keflex  and Motrin  - Submitted a stool sample and was treated with vancomycin  - Gastrointestinal symptoms have resolved      DA[employer] Copilot Consent:   The patient and/or family gave verbal consent today for  use of [employer] Copilot with electronic transmission  using iPhone for the purposes of capturing audio into the patient's chart/designated cloud.     Denies N/V/F/C/SOB/CP    All other systems were reviewed and are negative    The following portions of the patient's history were reviewed and updated as appropriate: allergies, current medications, past medical history, past surgical history, family history, and problem list.    Physical Exam:     Vitals:         Gen: AAOx3, NAD, Well developed    HEENT: Normocephalic, EOMI    Heart: Regular pulse rate and rhythm    Vascular:  Palpable pedal pulses b/l, CFT to all toes <3 sec. No edema. No varicosities    Derm:  Skin intact without wounds or rashes.  No erythema or ecchymosis.    Neuro:  SILT. Neg tinel's sign.  Able to wiggle toes. No motor deficit    MSK:   MMS 5/5 and symmetric, No other areas of pain or tenderness. No gross deformity. ROM normal without crepitus.    Physical Exam  GENERAL: Well developed, no acute distress    Clinical findings of the BL show the following  No calf pain noted  Sutures are intact with skin margins well coapted.  No erythema, trace edema consistent with postoperative changes is noted.  Rectus alignment appreciated.  No palpable or painful hardware noted.  CRT <3 sec  NVS intact        Radiology:    Results  Suture removal, left fourth toe  Suture removed from left fourth toe due to local swelling and sensitivity. Area bandaged following removal. No excessive bleeding or signs of infection.    Toe taping, left fourth toe  Left fourth toe hyperextended and taped to maintain alignment following digital deformity correction. Toe positioned even with third toe and secured with tape. No acute distress during taping. Toe remains swollen but alignment maintained.          Jeanean Hollett L. Leanne MAUL, FACFAS  [facility]

## 2024-05-17 ENCOUNTER — Ambulatory Visit (INDEPENDENT_AMBULATORY_CARE_PROVIDER_SITE_OTHER): Admitting: Foot & Ankle Surgery

## 2025-02-14 ENCOUNTER — Encounter: Admitting: Sports Medicine
